# Patient Record
Sex: Female | Born: 1970
Health system: Western US, Academic
[De-identification: ages and names within clinical notes are randomized; demographics above are authoritative.]

## PROBLEM LIST (undated history)

## (undated) DIAGNOSIS — Z8742 Personal history of other diseases of the female genital tract: Secondary | ICD-10-CM

## (undated) DIAGNOSIS — D219 Benign neoplasm of connective and other soft tissue, unspecified: Secondary | ICD-10-CM

## (undated) DIAGNOSIS — F32A Depression, unspecified: Secondary | ICD-10-CM

## (undated) DIAGNOSIS — A64 Unspecified sexually transmitted disease: Secondary | ICD-10-CM

## (undated) DIAGNOSIS — F419 Anxiety disorder, unspecified: Secondary | ICD-10-CM

## (undated) HISTORY — DX: Benign neoplasm of connective and other soft tissue, unspecified: D21.9

## (undated) HISTORY — DX: Depression, unspecified: F32.A

## (undated) HISTORY — DX: Unspecified sexually transmitted disease: A64

## (undated) HISTORY — PX: ABDOMINAL SURGERY: SHX537

## (undated) HISTORY — DX: Personal history of other diseases of the female genital tract: Z87.42

## (undated) HISTORY — PX: ESSURE TUBAL LIGATION: SUR464

## (undated) HISTORY — PX: BREAST BIOPSY: SHX20

## (undated) HISTORY — DX: Anxiety disorder, unspecified: F41.9

## (undated) HISTORY — PX: AUGMENTATION MAMMAPLASTY: SUR837

## (undated) HISTORY — PX: COSMETIC SURGERY: SHX468

---

## 2017-05-31 HISTORY — PX: CERVICAL BIOPSY  W/ LOOP ELECTRODE EXCISION: SUR135

## 2019-10-03 NOTE — Progress Notes
Date: 10/08/2019  Name: Samantha Waters   MRN#: [id number]   DOB: 1970/12/22   Age: 49 y.o.  _______________________________________________________________________________________________________________    Kathrin Penner. Lowella Fairy, MD  Lindell Noe, PA-C    CHIEF COMPLAINT: Right elbow pain     REFERRING PHYSICIAN: No primary care provider on file.    HISTORY PRESENT ILLNESS: The patient is a 49 y.o. year old Female  who presents to the office for an evaluation of her right elbow. The patient denies any injury to right elbow, however, notes persistent pain for approximately 14 weeks.  She states that she may have exacerbated her right elbow symptoms while moving boxes around that same time is not definitive. Since her onset of pain symptoms, the patient states difficulty with extending and flexing her right elbow. Patient goes on to states that her right elbow pain can keep her up at night.     The patient reports dull to sharp pain in the right elbow rated at a 7/10 in severity. The patient has increased discomfort in the elbow with any lifting, gripping, twisting, and pulling activities. She notes grabbing a cup of coffee and hold on to a steering wheel can aggravate her pain symptoms. Overall, her symptoms are progressively getting worse.      ROS:  A COVID-19 questionnaire was filled out by the patient prior to the visit that included questions about having had a positive COVID-19 diagnosis in the last 14 days, contact with anybody diagnosed with  COVID-19 in the last 14 days, fever, headaches, unexplained muscle pain, weakness, diarrhea, nausea, vomiting, abdominal pain, respiratory illness/cough, shortness of breath, loss of smell, loss of taste, rash, skin irritation, unexplained hemorrhage, and fatigue. The responses to those questions were negative. A temperature was taken and it was less than 100 F.      PREVIOUS MEDICAL HISTORY: History reviewed. No pertinent past medical history.    PREVIOUS SURGICAL HISTORY: History reviewed. No pertinent surgical history.    MEDICATIONS:   Current Outpatient Medications   Medication Sig   ??? meloxicam 7.5 mg tablet Take 1 tablet (7.5 mg total) by mouth two (2) times daily as needed for Pain.     No current facility-administered medications for this visit.        ALLERGIES: No Known Allergies    SOCIAL HISTORY:   Social History     Tobacco Use   ??? Smoking status: Never Smoker   ??? Smokeless tobacco: Never Used   Substance Use Topics   ??? Alcohol use: Yes   ??? Drug use: Never       FAMILY HISTORY: History reviewed. No pertinent family history.    REVIEW OF SYSTEMS: The 14-point review of systems as documented today in the medical record is remarkable for the positive orthopedic problems discussed above and their relevance was considered with respect to Constitutional, ENT, Cardiovascular, GU, Skin, Neurologic, Endocrine, Hematologic, Psychiatric, Gastrointestinal, Respiratory, Eyes and Allergic/Immunologic systems.    Examination:  Ht 4' 11'' (1.499 m)  ~ Wt 139 lb (63 kg)  ~ BMI 28.07 kg/m???     General: The patient is well-developed, well-nourished, and in no acute distress.  Body habitus is normal.  Patient is oriented ???3, to place, time and person.  Judgment, mood and affect are appropriate.  External exam of eyes, ears, nose and mouth reveal no deformities, scars or lesions.  Respirations are regular and unlabored.  Respiratory effort is normal with no evidence of abnormal intercostal retraction, or excessive use of accessory  muscles.  Extraocular movements are intact, pupils are symmetric.  No conjunctivitis or icterus is present.  Skin appears to be normal without rashes, lesions, or ulcers.  Pulses regular.  No cyanosis, clubbing or edema is evident.  Patient has no movement disorder or loss of sensation except as described below.  Gait and station are within normal limits except as described below.  No amputations are fixed deformities are evident.    Musculoskeletal:  RIGHT elbow: Inspection: No swelling is noted.  No scars are noted.    There is no deformity noted.    Palpation: There is mild tenderness noted laterally.  No effusion is present.    Range of motion exam:  Flexion: 120???  Extension: 0???  with pain.    Pronation: 70???   Supination: 70???     Crepitus is noted with range of motion.  There is no instability noted in the joint. Distal vitals are intact.  Neurologically intact from C5 to T1.  Skin is intact.  Normal capillary refill with 2+ radial and ulnar pulses.        Neurologic: No gross motor or sensory deficits    Vascular: Distal pulses are intact    Skin: No rashes or lesions    X-rays: Images were ordered, obtained and reviewed with the patient today at [facility].  Multiple views of the elbow include AP, lateral, and oblique views.  These were reviewed with the patient, and used for medical decision making.  These show no fractures, no deformities, and no dislocations.     Impression: right Lateral epicondylitis    Medical decision making: We had a lengthy discussion with the patient today regarding her condition. I discussed with the patient their physical exam findings, radiographs and imaging, and above diagnosis. We discussed the treatment options including anti-inflammatory medications, cortisone versus PRP injections, physical therapy, and tennis elbow bracing. At this time we will manage the patient???s symptoms conservatively. A corticosteroid injection was recommended to the patient. This injection is both diagnostic and therapeutic. The risks associated with the injection were discussed with the patient and they have elected to proceed. She was also educated to massage her area of pain symptoms to desensitize her symptoms. She is to continue with a tennis elbow strap for comfort and pain relief.  At this point we would like to start her on Mobic 15 mg p.o. daily with food and to stop all other anti-inflammatories.      All of the patient's questions and concerns were addressed.    Hand/Foot/Upper/Lower Extrem Drain/Inject: R elbow    Date/Time: 10/08/2019 7:10 AM  Performed by: Andres Shad., MD  Authorized by: Andres Shad., MD     Consent Given by:  Patient  Site marked: the procedure site was marked    Timeout: prior to procedure the correct patient, procedure, and site was verified    Indications:  Pain  Condition: epicondylitis, lateral    Location:  Tendon origin  Site:  R elbow  Approach:  Lateral  Needle Size:  25 G  Medications:  1 mL lidocaine 1%; 6 mg betamethasone acetate-betamethasone sodium phosphate 6 (3-3) mg/mL  Patient tolerance:  Patient tolerated the procedure well with no immediate complications   The patient has elected to proceed with cortisone injection. Risks of hypopigmentation, hyperpigmentation, subcutaneous fat atrophy, tendon rupture, transient hyperglycemia and allergic reactions were all discussed. After alcohol prep, 6 mg Celestone, mixed with 1 mL of 1% lidocaine plain was injected without incident.  The patient tolerated the procedure well.  Cortisone was administered deep to the common extensor tendon origin distal to the right lateral epicondyle.        Follow-up: The patient will follow up in 3-4 weeks for re-evaluation, or sooner if necessary.        Melba Coon MD  Sports Medicine  Orthopedic Surgery

## 2019-10-08 ENCOUNTER — Ambulatory Visit: Payer: PRIVATE HEALTH INSURANCE

## 2019-10-08 DIAGNOSIS — M7711 Lateral epicondylitis, right elbow: Secondary | ICD-10-CM

## 2019-10-08 DIAGNOSIS — M25521 Pain in right elbow: Secondary | ICD-10-CM

## 2019-10-08 MED ORDER — MELOXICAM 7.5 MG PO TABS
7.5 mg | ORAL_TABLET | Freq: Two times a day (BID) | ORAL | 1 refills | Status: AC | PRN
Start: 2019-10-08 — End: ?

## 2019-10-09 MED ADMIN — BETAMETHASONE SOD PHOS & ACET 6 (3-3) MG/ML IJ SUSP: INTRA_ARTICULAR | @ 14:00:00 | Stop: 2019-10-08 | NDC 00517072001

## 2019-10-09 MED ADMIN — LIDOCAINE HCL 1 % IJ SOLN: INTRA_ARTICULAR | @ 14:00:00 | Stop: 2019-10-08 | NDC 00409427601

## 2019-10-30 ENCOUNTER — Ambulatory Visit: Payer: PRIVATE HEALTH INSURANCE | Attending: Medical

## 2019-10-30 DIAGNOSIS — M7711 Lateral epicondylitis, right elbow: Secondary | ICD-10-CM

## 2019-10-30 NOTE — Progress Notes
Date: 10/30/2019  Name: Samantha Waters   Centricity#: [id number]   DOB: 01-09-1971   Age: 49 y.o.      Loraine Leriche L. Lowella Fairy, MD  Lindell Noe, PA-C      CHIEF COMPLAINT:   Chief Complaint   Patient presents with   ??? Right Elbow - Follow-up       REFERRING PHYSICIAN: No primary care provider on file.    HISTORY PRESENT ILLNESS: The patient is a 49 y.o. year old Female  who returns today for follow up status post cortisone injection on 10/08/2019.  The patient is being treated for right elbow Lateral epicondylitis with  []  Physical Therapy   [x]   cortisone injections   [x]  home exercises   []  tennis elbow strap   []  Activity modifications   []  PRP injection   [x]  Meloxicam   []  Cast Immobilization  []  Surgery. The patient is doing better.  She reports episodes of pain within the elbow with squeezing and lifting.  Her range of motion is improved since the injection.  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  The patient denies any new injury. They are currently attending physical therapy.     Physical Exam:   Right Elbow:   Inspection: No swelling is noted. no scars are noted.    There is no deformity noted.    Palpation: There is  []  No  []  Mild  [x]  Moderate  []  Severe  tenderness noted laterally.  No effusion is present.    Range of motion exam:  Flexion: 130???  Extension: 0???  without pain.    Pronation: 80???   Supination: 80???     Crepitus is not noted with range of motion.  There is no instability noted in the joint. Distal vitals are intact.  Skin is intact.  Normal capillary refill with 2+ radial and ulnar pulses.       Resisted Wrist Extension Test:   [x]   Positive    []  Negative    []  Not tested  Resisted  Wrist Flexion Test:      []   Positive    []  Negative    [x]  Not tested  Long Finger Test:            []   Positive    [x]  Negative    []  Not tested  Pain with Resisted Supination:   []   Yes           [x]   No  Pain with Resisted Pronation:     []   Yes           [x]   No  Milking Sign:            []   Positive    []  Negative    [x]  Not tested     Neurologic: No gross motor or sensory deficits    Vascular: Distal pulses are intact    Skin: No rashes or lesions    X-Ray: None taken today.     Impression: Right elbow Lateral epicondylitis    Plan: The patient is doing okay.  An examination was performed today.   I recommend that she continues to use ice and heat as necessary. Pain medications were not prescribed.  The patient is currently using Meloxicam 7.5 mg for pain control. The patient will hold off on physical therapy.  Continue the current treatment plan.  A home exercise program  has been provided to the patient.  At home  stretches and strengthening as were demonstrated with the patient in clinic today.  She will continue with conservative treatment.  If her symptoms worsens we discussed PRP injections.  She is provided with information on PRP injection today.  She will follow up in 4-6 weeks for re-evaluation.    Follow-up with our office Return in about 6 weeks (around 12/11/2019)., sooner if necessary.     The patient was examined and evaluated by Lindell Noe, PA-C for Melba Coon, M.D. The evaluation and plan was reviewed and approved by Melba Coon, M.D.        _______________________  Lindell Noe, PA-C    Patient was initially seen by Dr. Lowella Fairy on 10/08/2019 in the [place] office at which time of plan of care was established to treat patient's condition.  I have reviewed the plan of care outlined by the supervising physician.  Patient is seen in follow-up today incident to that visit to initiate plan of care as per the supervising physician who is present in the office today.      A COVID-19 questionnaire was filled out by the patient prior to the visit that included questions about having had a positive COVID-19 diagnosis in the last 14 days, contact with anybody diagnosed with  COVID-19 in the last 14 days, fever, headaches, unexplained muscle pain, weakness, diarrhea, nausea, vomiting, abdominal pain, respiratory illness/cough, shortness of breath, loss of smell, loss of taste, rash, skin irritation, unexplained hemorrhage and fatigue. The responses to those questions were negative. A temperature was taken and it was less than 100 F.      cc: No primary care provider on file.

## 2019-12-12 ENCOUNTER — Ambulatory Visit: Payer: PRIVATE HEALTH INSURANCE | Attending: Medical

## 2020-02-16 ENCOUNTER — Ambulatory Visit: Payer: PRIVATE HEALTH INSURANCE

## 2021-06-24 LAB — EXTERNAL GENERIC LAB PROCEDURE: COLOGUARD: NEGATIVE

## 2021-06-24 LAB — COLOGUARD: COLOGUARD: NEGATIVE

## 2021-09-16 ENCOUNTER — Other Ambulatory Visit: Payer: Self-pay | Admitting: Nurse Practitioner

## 2021-09-16 DIAGNOSIS — N950 Postmenopausal bleeding: Secondary | ICD-10-CM

## 2021-09-26 ENCOUNTER — Ambulatory Visit
Admission: RE | Admit: 2021-09-26 | Discharge: 2021-09-26 | Disposition: A | Discharge status: Home / Self Care | Payer: 59 | Source: Ambulatory Visit | Attending: Nurse Practitioner | Admitting: Nurse Practitioner

## 2021-09-26 ENCOUNTER — Other Ambulatory Visit: Payer: Self-pay

## 2021-09-26 DIAGNOSIS — N950 Postmenopausal bleeding: Secondary | ICD-10-CM

## 2021-09-26 DIAGNOSIS — N83209 Unspecified ovarian cyst, unspecified side: Secondary | ICD-10-CM | POA: Insufficient documentation

## 2021-09-29 ENCOUNTER — Encounter: Payer: Self-pay | Admitting: Nurse Practitioner

## 2021-10-05 ENCOUNTER — Encounter: Payer: Self-pay | Admitting: Obstetrics and Gynecology

## 2021-10-05 ENCOUNTER — Ambulatory Visit (INDEPENDENT_AMBULATORY_CARE_PROVIDER_SITE_OTHER): Payer: 59 | Admitting: Obstetrics and Gynecology

## 2021-10-05 VITALS — BP 130/78 | HR 84 | Ht 58.5 in | Wt 140.0 lb

## 2021-10-05 DIAGNOSIS — N9489 Other specified conditions associated with female genital organs and menstrual cycle: Secondary | ICD-10-CM

## 2021-10-05 DIAGNOSIS — D219 Benign neoplasm of connective and other soft tissue, unspecified: Secondary | ICD-10-CM | POA: Diagnosis not present

## 2021-10-05 DIAGNOSIS — N939 Abnormal uterine and vaginal bleeding, unspecified: Secondary | ICD-10-CM

## 2021-10-05 NOTE — Progress Notes (Unsigned)
GYNECOLOGY  VISIT   HPI: 51 y.o.   Married  Caucasian  female   Obstetric history G6P3L4. with Patient's last menstrual period was 08/10/2021 (exact date).   here for abnormal bleeding.  Bleeds 2 weeks after her cycle and with exercise.   States she would like a hysterectomy.  Declines future childbearing.   Hx of bleeding problems that are long term.   Her periods never completely stopped.  Has not gone a full year without a menstrual cycle. Had mood changes, brain fog, headaches, and irregular cycles, so she started HRT. Never had a hot flash.  She has stopped her HRT and does not feel different after stopping her HRT.   Long hx of endometrial cells on paps.  Her last EMB was in June 2022 due to a 22 day menstrual cycle.  Hx fibroids.   She had a pelvic ultrasound on 09/26/21.  Study Result  Narrative & Impression  CLINICAL DATA:  Post menopausal bleeding   EXAM: TRANSABDOMINAL AND TRANSVAGINAL ULTRASOUND OF PELVIS   TECHNIQUE: Both transabdominal and transvaginal ultrasound examinations of the pelvis were performed. Transabdominal technique was performed for global imaging of the pelvis including uterus, ovaries, adnexal regions, and pelvic cul-de-sac. It was necessary to proceed with endovaginal exam following the transabdominal exam to visualize the uterus endometrium ovaries.   COMPARISON:  None Available.   FINDINGS: Uterus   Measurements: 9.2 x 5.8 x 6.1 cm = volume: 172 mL. Uterine fibroids, the largest most discrete masses are measured. Anterior uterine corpus intramural fibroid measures 16 by 20 x 25 mm. Subserosal posterior hypoechoic fibroid measures 15 x 16 x 20 mm.   Endometrium   Thickness: 6.3 mm.  No focal abnormality visualized.   Right ovary   Measurements: 2.9 x 2.3 x 1.7 cm = volume: 5.8 mL. Normal appearance/no adnexal mass.   Left ovary   Left ovarian tissue not discretely visualized. On trans abdominal images, there is a left  adnexal hypoechoic cyst measuring 34 x 35 x 31 mm which may contain a few internal septa.   Other findings   No abnormal free fluid.   IMPRESSION: 1. Endometrial thickness of 6.3 mm. In the setting of post-menopausal bleeding, endometrial sampling is indicated to exclude carcinoma. If results are benign, sonohysterogram should be considered for focal lesion work-up. (Ref: Radiological Reasoning: Algorithmic Workup of Abnormal Vaginal Bleeding with Endovaginal Sonography and Sonohysterography. AJR 2008; 616:W73-71) 2. Uterine fibroids 3. 3.5 cm complex left adnexal cyst. Surgical consultation is recommended.     Electronically Signed   By: Donavan Foil M.D.   On: 09/26/2021 15:21     Has health anxiety.  First husband died from cancer.   GYNECOLOGIC HISTORY: Patient's last menstrual period was 08/10/2021 (exact date). Contraception:  Essure Menopausal hormone therapy:  none Last mammogram: 04/2021 extremely dense breasts;rec.breast US--done in [place] Last pap smear: 05-02-21 ASCUS:Pos HR HPV;06-21-21 ECC Neg.  Hx of LEEP revealing CIN II 2019        OB History     Gravida  6   Para  3   Term  2   Preterm  1   AB  3   Living  4      SAB  3   IAB      Ectopic      Multiple  1   Live Births  4              There are no problems to display for this patient.  Past Medical History:  Diagnosis Date   Anxiety    Depression    Fibroid    History of abnormal cervical Pap smear    Hx LEEP procedure 2019   STD (sexually transmitted disease)    HR HPV    Past Surgical History:  Procedure Laterality Date   ABDOMINAL SURGERY     "tummy tuck"   AUGMENTATION MAMMAPLASTY     silicone breast implants   CERVICAL BIOPSY  W/ LOOP ELECTRODE EXCISION  05/31/2017   CESAREAN SECTION     ESSURE TUBAL LIGATION      No current outpatient medications on file.   No current facility-administered medications for this visit.     ALLERGIES: Patient has no known  allergies.  Family History  Problem Relation Age of Onset   Hypertension Mother    Diabetes Mother    Stroke Father    Hyperlipidemia Father    Coronary artery disease Father    Diabetes Brother    Breast cancer Paternal Grandmother 26    Social History   Socioeconomic History   Marital status: Married    Spouse name: Not on file   Number of children: Not on file   Years of education: Not on file   Highest education level: Not on file  Occupational History   Not on file  Tobacco Use   Smoking status: Former    Types: Cigarettes   Smokeless tobacco: Never  Vaping Use   Vaping Use: Never used  Substance and Sexual Activity   Alcohol use: Never   Drug use: Not on file   Sexual activity: Yes    Birth control/protection: Surgical    Comment: Visual merchandiser, 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   PHYSICAL EXAMINATION:    BP 130/78   Pulse 84   Ht 4' 10.5" (1.486 m)   Wt 140 lb (63.5 kg)   LMP 08/10/2021 (Exact Date)   SpO2 99%   BMI 28.76 kg/m     General appearance: alert, cooperative and appears stated age Head: Normocephalic, without obvious abnormality, atraumatic Neck: no adenopathy, supple, symmetrical, trachea midline and thyroid normal to inspection and palpation Lungs: clear to auscultation bilaterally Heart: regular rate and rhythm Abdomen: soft, non-tender, no masses,  no organomegaly Extremities: extremities normal, atraumatic, no cyanosis or edema Skin: Skin color, texture, turgor normal. No rashes or lesions Lymph nodes: Cervical, supraclavicular,  and axillary nodes normal. No abnormal inguinal nodes palpated Neurologic: Grossly normal  Pelvic: External genitalia:  no lesions              Urethra:  normal appearing urethra with no masses, tenderness or lesions              Bartholins and Skenes: normal                 Vagina: normal appearing vagina with normal color and discharge, no lesions              Cervix: no lesions  Bimanual Exam:  Uterus:  normal size, contour, position, consistency, mobility, non-tender              Adnexa: no mass, fullness, tenderness              Rectal exam: yes.  Confirms.              Anus:  normal sphincter tone, no lesions  Chaperone was present for exam:  yes.  ASSESSMENT  Fibroids.  Left ovarian cyst.  Abnormal uterine bleeding.  Stopped HRT.   PLAN  Return for endometrial biopsy and recheck.  We discussed potential hysterectomy.     An After Visit Summary was printed and given to the patient.  ______ minutes face to face time of which over 50% was spent in counseling.

## 2021-10-05 NOTE — Patient Instructions (Signed)
Total Laparoscopic Hysterectomy A total laparoscopic hysterectomy is a minimally invasive surgery to remove the uterus and cervix. The fallopian tubes and ovaries can also be removed during this surgery, if necessary. This procedure may be done to treat problems such as: Growths in the uterus (uterine fibroids) that are not cancer but cause symptoms. A condition that causes the lining of the uterus to grow in other areas (endometriosis). Problems with pelvic support. Cancer of the cervix, ovaries, uterus, or tissue that lines the uterus (endometrium). Excessive bleeding in the uterus. After this procedure, you will no longer be able to have a baby, and you will no longer have a menstrual period. Tell a health care provider about: Any allergies you have. All medicines you are taking, including vitamins, herbs, eye drops, creams, and over-the-counter medicines. Any problems you or family members have had with anesthetic medicines. Any blood disorders you have. Any surgeries you have had. Any medical conditions you have. Whether you are pregnant or may be pregnant. What are the risks? Generally, this is a safe procedure. However, problems may occur, including: Infection. Bleeding. Blood clots in the legs or lungs. Allergic reactions to medicines. Damage to nearby structures or organs. Having to change from this surgery to one in which a large incision is made in the abdomen (abdominal hysterectomy). What happens before the procedure? Staying hydrated Follow instructions from your health care provider about hydration, which may include: Up to 2 hours before the procedure - you may continue to drink clear liquids, such as water, clear fruit juice, black coffee, and plain tea.  Eating and drinking restrictions Follow instructions from your health care provider about eating and drinking, which may include: 8 hours before the procedure - stop eating heavy meals or foods, such as meat, fried  foods, or fatty foods. 6 hours before the procedure - stop eating light meals or foods, such as toast or cereal. 6 hours before the procedure - stop drinking milk or drinks that contain milk. 2 hours before the procedure - stop drinking clear liquids. Medicines Ask your health care provider about: Changing or stopping your regular medicines. This is especially important if you are taking diabetes medicines or blood thinners. Taking medicines such as aspirin and ibuprofen. These medicines can thin your blood. Do not take these medicines unless your health care provider tells you to take them. Taking over-the-counter medicines, vitamins, herbs, and supplements. You may be asked to take medicine that helps you have a bowel movement (laxative) to prevent constipation. General instructions If you were asked to do bowel preparation before the procedure, follow instructions from your health care provider. This procedure can affect the way you feel about yourself. Talk with your health care provider about the physical and emotional changes hysterectomy may cause. Do not use any products that contain nicotine or tobacco for at least 4 weeks before the procedure. These products include cigarettes, chewing tobacco, and vaping devices, such as e-cigarettes. If you need help quitting, ask your health care provider. Plan to have a responsible adult take you home from the hospital or clinic. Plan to have a responsible adult care for you for the time you are told after you leave the hospital or clinic. This is important. Surgery safety Ask your health care provider: How your surgery site will be marked. What steps will be taken to help prevent infection. These may include: Removing hair at the surgery site. Washing skin with a germ-killing soap. Receiving antibiotic medicine. What happens during the  procedure? An IV will be inserted into one of your veins. You will be given one or more of the following: A  medicine to help you relax (sedative). A medicine to make you fall asleep (general anesthetic). A medicine to numb the area (local anesthetic). A medicine that is injected into your spine to numb the area below and slightly above the injection site (spinal anesthetic). A medicine that is injected into an area of your body to numb everything below the injection site (regional anesthetic). A gas will be used to inflate your abdomen. This will allow your surgeon to look inside your abdomen and do the surgery. Three or four small incisions will be made in your abdomen. A small device with a light (laparoscope) will be inserted into one of your incisions. Surgical instruments will be inserted through the other incisions in order to perform the procedure. Your uterus and cervix may be removed through your vagina or cut into small pieces and removed through the small incisions. Any other organs that need to be removed will also be removed this way. The gas will be released from inside your abdomen. Your incisions will be closed with stitches (sutures), skin glue, or adhesive strips. A bandage (dressing) may be placed over your incisions. The procedure may vary among health care providers and hospitals. What happens after the procedure? Your blood pressure, heart rate, breathing rate, and blood oxygen level will be monitored until you leave the hospital or clinic. You will be given medicine for pain as needed. You will be encouraged to walk as soon as possible. You will also use a device to help you breathe or do breathing exercises to keep your lungs clear. You may have to wear compression stockings. These stockings help to prevent blood clots and reduce swelling in your legs. You will need to wear a sanitary pad for vaginal discharge or bleeding. Summary Total laparoscopic hysterectomy is a procedure to remove your uterus, cervix, and sometimes the fallopian tubes and ovaries. This procedure can  affect the way you feel about yourself. Talk with your health care provider about the physical and emotional changes hysterectomy may cause. After this procedure, you will no longer be able to have a baby, and you will no longer have a menstrual period. You will be given pain medicine to control discomfort after this procedure. Plan to have a responsible adult take you home from the hospital or clinic. This information is not intended to replace advice given to you by your health care provider. Make sure you discuss any questions you have with your health care provider. Document Revised: 12/05/2019 Document Reviewed: 12/05/2019 Elsevier Patient Education  [place].

## 2021-10-06 LAB — CA 125: CA 125: 18 U/mL (ref <35)

## 2021-10-12 ENCOUNTER — Encounter: Payer: Self-pay | Admitting: Family Medicine

## 2021-10-12 ENCOUNTER — Ambulatory Visit (INDEPENDENT_AMBULATORY_CARE_PROVIDER_SITE_OTHER): Payer: 59 | Admitting: Family Medicine

## 2021-10-12 VITALS — BP 110/78 | HR 75 | Temp 97.1°F | Ht 58.5 in | Wt 143.2 lb

## 2021-10-12 DIAGNOSIS — N939 Abnormal uterine and vaginal bleeding, unspecified: Secondary | ICD-10-CM

## 2021-10-12 DIAGNOSIS — F419 Anxiety disorder, unspecified: Secondary | ICD-10-CM | POA: Insufficient documentation

## 2021-10-12 DIAGNOSIS — F331 Major depressive disorder, recurrent, moderate: Secondary | ICD-10-CM | POA: Diagnosis not present

## 2021-10-12 DIAGNOSIS — F32A Depression, unspecified: Secondary | ICD-10-CM | POA: Diagnosis not present

## 2021-10-12 DIAGNOSIS — F329 Major depressive disorder, single episode, unspecified: Secondary | ICD-10-CM | POA: Insufficient documentation

## 2021-10-12 HISTORY — DX: Abnormal uterine and vaginal bleeding, unspecified: N93.9

## 2021-10-12 MED ORDER — HYDROXYZINE HCL 10 MG PO TABS: 10.0000 mg | ORAL_TABLET | Freq: Three times a day (TID) | ORAL | 0 refills | Status: DC | PRN

## 2021-10-12 MED ORDER — BUPROPION HCL ER (XL) 150 MG PO TB24: 150.0000 mg | ORAL_TABLET | Freq: Every day | ORAL | 2 refills | Status: DC

## 2021-10-12 NOTE — Patient Instructions (Signed)
Start Wellbutrin daily and  Atarax as needed

## 2021-10-12 NOTE — Assessment & Plan Note (Signed)
Elevated GAD-7 Trial Wellbutrin 150 mg XR plus augmentation with hydroxyzine If no improvement or worsening, consider referral to psychiatry

## 2021-10-12 NOTE — Progress Notes (Signed)
Sierra Cantrell is a 51 y.o. female who presents today for an office visit.  Assessment/Plan:   Problem List Items Addressed This Visit       Genitourinary   Abnormal uterine bleeding (AUB)     Other   Anxiety    Elevated GAD-7 Trial Wellbutrin 150 mg XR plus augmentation with hydroxyzine If no improvement or worsening, consider referral to psychiatry      Relevant Medications   buPROPion (WELLBUTRIN XL) 150 MG 24 hr tablet   hydrOXYzine (ATARAX) 10 MG tablet   MDD (major depressive disorder)    Chronic No SI/HI Failed multiple SSRIs Discussed referral to psychiatry versus counseling versus primary care management We will trial Wellbutrin 150 mg XR If no improvement or worsening, low threshold to refer to psychiatry      Relevant Medications   buPROPion (WELLBUTRIN XL) 150 MG 24 hr tablet   hydrOXYzine (ATARAX) 10 MG tablet   RESOLVED: Depression - Primary   Relevant Medications   buPROPion (WELLBUTRIN XL) 150 MG 24 hr tablet   hydrOXYzine (ATARAX) 10 MG tablet       Subjective:  HPI:  Sierra Cantrell is a 51 y.o. female who has Anxiety; Abnormal uterine bleeding (AUB); and MDD (major depressive disorder) on their problem list..   She  has a past medical history of Anxiety, Depression, Fibroid, History of abnormal cervical Pap smear, and STD (sexually transmitted disease)..   She presents with chief complaint of Establish Care (PHQ/GAD . Patient will bring in colorguard,pap and mammogram records on next visit) .   Patient reports that she has recently to [place] from [place] roughly about a month ago.  She moved out with her husband.  Does not have any other family in the area.  Her 3 children and 2 grandchildren are back in [place].  She says that she feels very depressed that she has nothing to do.  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  Patient reports a nearly  lifelong history of MDD and anxiety. Patient has taken Paxil, Celexa, and Lexapro in the past.  These have helped some, but patient did have significant weight gain with these.    Patient has a history of mood disturbance associated with menopause.  Was on HRT with this.  Patient recently also had abnormal uterine bleeding.  Patient did have pelvic ultrasound that showed 6 cm endometrial thickening and an left-sided 3 cm adnexal mass.  Patient has seen OB/GYN for this.  They have an upcoming planned endometrial biopsy.  CA 125 was within normal limits.       Objective:  Physical Exam: BP 110/78 (BP Location: Left Arm, Patient Position: Sitting, Cuff Size: Large)   Pulse 75   Temp (!) 97.1 F (36.2 C) (Temporal)   Ht 4' 10.5" (1.486 m)   Wt 143 lb 3.2 oz (65 kg)   LMP 08/10/2021 (Exact Date)   SpO2 98%   BMI 29.42 kg/m    General: No acute distress. Awake and conversant.  Eyes: Normal conjunctiva, anicteric. Round symmetric pupils.  ENT: Hearing grossly intact. No nasal discharge.  Neck: Neck is supple. No masses or thyromegaly.  Respiratory: Respirations are non-labored. No auditory wheezing.  Skin: Warm. No rashes or ulcers.  Psych: Alert and oriented. Cooperative, tearful, Normal judgment.  CV: No cyanosis or JVD MSK: Normal ambulation. No clubbing  Neuro: Sensation and CN II-XII grossly normal.  Alesia Banda, MD, MS

## 2021-10-12 NOTE — Assessment & Plan Note (Signed)
Chronic No SI/HI Failed multiple SSRIs Discussed referral to psychiatry versus counseling versus primary care management We will trial Wellbutrin 150 mg XR If no improvement or worsening, low threshold to refer to psychiatry

## 2021-10-16 ENCOUNTER — Telehealth: Payer: Self-pay | Admitting: Obstetrics and Gynecology

## 2021-10-16 NOTE — Telephone Encounter (Signed)
The patient called yesterday, she feels that her abdomen is growing and getting tight. She is very worried that something is wrong. She was diagnosed with fibroids and a complex cyst on 09/26/21. She is scheduled to see Dr Quincy Simmonds min July and doesn't feel she can wait until next week to have a follow up appointment with Dr Quincy Simmonds.  Please find a spot for her to be seen this week.

## 2021-10-17 NOTE — Telephone Encounter (Signed)
Patient is scheduled 10/20/21 at 1:30pm with Dr. Quincy Simmonds.

## 2021-10-17 NOTE — Telephone Encounter (Signed)
Encounter reviewed and closed.  

## 2021-10-17 NOTE — Telephone Encounter (Signed)
This message forwarded high priority to the appt desk.

## 2021-10-19 ENCOUNTER — Other Ambulatory Visit: Payer: Self-pay

## 2021-10-19 DIAGNOSIS — N939 Abnormal uterine and vaginal bleeding, unspecified: Secondary | ICD-10-CM

## 2021-10-19 NOTE — Progress Notes (Signed)
GYNECOLOGY  VISIT   HPI: 51 y.o.   Married  Caucasian  female   [phone] with Patient's last menstrual period was 08/10/2021 (exact date).   here for endometrial biopsy.    Patient is having abnormal uterine bleeding, and she has known fibroids.  Also has a small complex left ovarian cyst. US done 10/05/21.  See Epic. CA125 18.   Patient having increased abdominal pressure, tightness around her umbilicus, and her pants feel uncomfortable.  Also having twinges and flutters.  She was in the process of moving when she experienced her increased symptoms.   Had vaginal spotting around 09/26/21 at the time of her recent pelvic ultrasound.   Is now off HRT.  States she never stopped bleeding prior to staring HRT.   She has had an Essure.  Planning on a trip on August 19, and is going to [place]. Father is in Hospice care and just had a stroke a few days ago.  He is unable to speak.  The family will reunite in August to see him.    GYNECOLOGIC HISTORY: Patient's last menstrual period was 08/10/2021 (exact date). Contraception:  Essure Menopausal hormone therapy:  none Last mammogram:  04/2021 extremely dense breasts;rec.breast US--done in [place] Last pap smear:   05-02-21 ASCUS:Pos HR HPV;06-21-21 ECC Neg.  Hx of LEEP revealing CIN II 2019        OB History     Gravida  6   Para  3   Term  2   Preterm  1   AB  3   Living  4      SAB  3   IAB      Ectopic      Multiple  1   Live Births  4              Patient Active Problem List   Diagnosis Date Noted   Anxiety 10/12/2021   Abnormal uterine bleeding (AUB) 10/12/2021   MDD (major depressive disorder) 10/12/2021    Past Medical History:  Diagnosis Date   Anxiety    Depression    Fibroid    History of abnormal cervical Pap smear    Hx LEEP procedure 2019   STD (sexually transmitted disease)    HR HPV    Past Surgical History:  Procedure Laterality Date   ABDOMINAL SURGERY     "tummy tuck"    AUGMENTATION MAMMAPLASTY     silicone breast implants   CERVICAL BIOPSY  W/ LOOP ELECTRODE EXCISION  05/31/2017   CESAREAN SECTION     COSMETIC SURGERY  10/2009 10/2013   ESSURE TUBAL LIGATION      No current outpatient medications on file.   No current facility-administered medications for this visit.     ALLERGIES: Wellbutrin [bupropion]  Family History  Problem Relation Age of Onset   Hypertension Mother    Diabetes Mother    Stroke Father    Hyperlipidemia Father    Coronary artery disease Father    Diabetes Brother    Breast cancer Paternal Grandmother 36    Social History   Socioeconomic History   Marital status: Married    Spouse name: Not on file   Number of children: Not on file   Years of education: Not on file   Highest education level: Not on file  Occupational History   Not on file  Tobacco Use   Smoking status: Former    Packs/day: 0.50    Years: 10.00  Total pack years: 5.00    Types: Cigarettes    Quit date: 09/25/2009    Years since quitting: 12.0   Smokeless tobacco: Never  Vaping Use   Vaping Use: Never used  Substance and Sexual Activity   Alcohol use: Never   Drug use: Never   Sexual activity: Yes    Birth control/protection: Surgical    Comment: Essure/partner vas, 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   PHYSICAL EXAMINATION:    BP 110/70   Ht 4' 10.5" (1.486 m)   Wt 140 lb (63.5 kg)   LMP 08/10/2021 (Exact Date)   BMI 28.76 kg/m     General appearance: alert, cooperative and appears stated age   Pelvic: External genitalia:  no  lesions              Urethra:  normal appearing urethra with no masses, tenderness or lesions              Bartholins and Skenes: normal                 Vagina: normal appearing vagina with normal color and discharge, no lesions              Cervix: no lesions                Bimanual Exam:  Uterus:  normal size, contour, position, consistency, mobility, non-tender              Adnexa: no mass, fullness, tenderness  EMB Consent done.  Sterile prep with Hibiclens.  Paracervical block with 10 cc 1% lidocaine.  Lot [id number], exp 04/18/23. EMB to 8 cm x 2.  Tissue to pathology.  No complications.  Minimal EBL.  Chaperone was present for exam:  Estill Bamberg, CMA  ASSESSMENT  Abdominal pressure. Ovulation? Abnormal uterine bleeding.  Fibroids.  Small complex left ovarian cyst.  Normal CA125.  Family health concern.   PLAN  Prior pelvic US report reviewed with patient.  FU EMB.  Check FSH and estradiol.  Pelvic US at the end of July.  Final recommendations to follow after EMB and labs have returned.  Support given for father's illness.  I encouraged reunion with him soon, if possible for her.    An After Visit Summary was printed and given to the patient.  30 min  total time was spent for this patient encounter, including preparation, face-to-face counseling with the patient, coordination of care, and documentation of the encounter in addition to performing endometrial biopsy.

## 2021-10-20 ENCOUNTER — Other Ambulatory Visit (HOSPITAL_COMMUNITY)
Admission: RE | Admit: 2021-10-20 | Discharge: 2021-10-20 | Disposition: A | Discharge status: Home / Self Care | Payer: 59 | Source: Ambulatory Visit | Attending: Obstetrics and Gynecology | Admitting: Obstetrics and Gynecology

## 2021-10-20 ENCOUNTER — Ambulatory Visit (INDEPENDENT_AMBULATORY_CARE_PROVIDER_SITE_OTHER): Payer: 59 | Admitting: Obstetrics and Gynecology

## 2021-10-20 ENCOUNTER — Encounter: Payer: Self-pay | Admitting: Obstetrics and Gynecology

## 2021-10-20 VITALS — BP 110/70 | Ht 58.5 in | Wt 140.0 lb

## 2021-10-20 DIAGNOSIS — N939 Abnormal uterine and vaginal bleeding, unspecified: Secondary | ICD-10-CM | POA: Diagnosis present

## 2021-10-20 DIAGNOSIS — Z01812 Encounter for preprocedural laboratory examination: Secondary | ICD-10-CM | POA: Diagnosis not present

## 2021-10-20 DIAGNOSIS — N83202 Unspecified ovarian cyst, left side: Secondary | ICD-10-CM

## 2021-10-20 DIAGNOSIS — Z8489 Family history of other specified conditions: Secondary | ICD-10-CM

## 2021-10-20 DIAGNOSIS — R109 Unspecified abdominal pain: Secondary | ICD-10-CM

## 2021-10-20 LAB — PREGNANCY, URINE: Preg Test, Ur: NEGATIVE

## 2021-10-21 LAB — ESTRADIOL: Estradiol: 130 pg/mL

## 2021-10-21 LAB — FOLLICLE STIMULATING HORMONE: FSH: 14.2 m[IU]/mL

## 2021-10-24 LAB — SURGICAL PATHOLOGY

## 2021-10-27 ENCOUNTER — Ambulatory Visit: Payer: 59 | Admitting: Obstetrics and Gynecology

## 2021-11-09 ENCOUNTER — Ambulatory Visit: Payer: 59 | Admitting: Family Medicine

## 2021-11-09 NOTE — Progress Notes (Signed)
GYNECOLOGY  VISIT   HPI: 51 y.o.   Married  Caucasian  female   [phone] with No LMP recorded.   here for pelvic ultrasound.   Bleeding now for 16 days.   Husband present for the visit.   Bled July 15 and 16th and then had spotting, but bleeding started again.  Some headaches also, which is her usual pattern.   Spotting is acceptable to her.  She is looking for reassurance that she does not have cancer.   Has long history of abnormal uterine bleeding and known fibroids.  Hx complex left ovarian cyst noted on pelvic US 10/05/21. CA125 was 18.   EMB done 10/20/21 showed benign secretory endometrium.  E2 130 and FSH 14.2 on 10/20/21.  She is off her HRT.   Planning a visit to see her father, who is in poor health.   GYNECOLOGIC HISTORY: No LMP recorded. Contraception:  Essure Menopausal hormone therapy:  none Last mammogram:  04/2021 extremely dense breasts;rec.breast US--done in [place] Last pap smear:   05-02-21 ASCUS:Pos HR HPV;06-21-21 ECC Neg.  Hx of LEEP revealing CIN II 2019               OB History     Gravida  6   Para  3   Term  2   Preterm  1   AB  3   Living  4      SAB  3   IAB      Ectopic      Multiple  1   Live Births  4              Patient Active Problem List   Diagnosis Date Noted   Anxiety 10/12/2021   Abnormal uterine bleeding (AUB) 10/12/2021   MDD (major depressive disorder) 10/12/2021    Past Medical History:  Diagnosis Date   Anxiety    Depression    Fibroid    History of abnormal cervical Pap smear    Hx LEEP procedure 2019   STD (sexually transmitted disease)    HR HPV    Past Surgical History:  Procedure Laterality Date   ABDOMINAL SURGERY     "tummy tuck"   AUGMENTATION MAMMAPLASTY     silicone breast implants   CERVICAL BIOPSY  W/ LOOP ELECTRODE EXCISION  05/31/2017   CESAREAN SECTION     COSMETIC SURGERY  10/2009 10/2013   ESSURE TUBAL LIGATION      No current outpatient medications on file.   No current  facility-administered medications for this visit.     ALLERGIES: Wellbutrin [bupropion]  Family History  Problem Relation Age of Onset   Hypertension Mother    Diabetes Mother    Stroke Father    Hyperlipidemia Father    Coronary artery disease Father    Diabetes Brother    Breast cancer Paternal Grandmother 28    Social History   Socioeconomic History   Marital status: Married    Spouse name: Not on file   Number of children: Not on file   Years of education: Not on file   Highest education level: Not on file  Occupational History   Not on file  Tobacco Use   Smoking status: Former    Packs/day: 0.50    Years: 10.00    Total pack years: 5.00    Types: Cigarettes    Quit date: 09/25/2009    Years since quitting: 12.1   Smokeless tobacco: Never  Vaping Use  Vaping Use: Never used  Substance and Sexual Activity   Alcohol use: Never   Drug use: Never   Sexual activity: Yes    Birth control/protection: Surgical    Comment: Essure/partner vas, first intercourse <16  Other Topics Concern   Not on file  Social History Narrative   Not on file   Social Determinants of Health   Financial Resource Strain: Not on file  Food Insecurity: Not on file  Transportation Needs: Not on file  Physical Activity: Not on file  Stress: Not on file  Social Connections: Not on file  Intimate Partner Violence: Not on file    Review of Systems  PHYSICAL EXAMINATION:    BP 122/68   Pulse 81   Ht 4' 10.5" (1.486 m)   Wt 140 lb (63.5 kg)   SpO2 94%   BMI 28.76 kg/m     General appearance: alert, cooperative and appears stated age   Pelvic US -  Uterus 9.10 x 6.53 x 4.81 cm.  Multiple fibroids:  intramural and subserosal.  0.92 cm, 1.54 cm, 1.36 cm, 1.02 cm, 0.95 cm, 1.05 cm.  EMS 4.76 mm.  Symmetrical.  Echogenic area at the fundus may extend through myometrium and outside the uterus. Possible Essure. (Not previously noted with pelvic US in June, 2023.) Left ovary 2.62 x  1.39 x 1.85 cm.  Right ovary 2.49 x 2.00 x 1.10 cm.  No adnexal masses.  Prominent vessels in the left adnexa. No free fluid.   ASSESSMENT  Abnormal uterine bleeding.  Fibroids.  Small complex left ovarian cyst.  Normal CA125. Cyst resolved.  Essure- possibly in the myometrium and traversing the serosa.  ASCUS pap, positive HR HPV, normal ECC at outside office.   PLAN  Pelvic US images and report reviewed including possible malposition of Essure.  Reassurance given that there is no suspicion for cancer.  Observational management of bleeding.  We have previously discussed potential laparoscopic hysterectomy.  Annual exam in January 2024 with pap and HR HPV then.  FU prn heavy or increased prolonged bleeding or pain.    An After Visit Summary was printed and given to the patient.  29 min  total time was spent for this patient encounter, including preparation, face-to-face counseling with the patient, coordination of care, and documentation of the encounter.

## 2021-11-10 ENCOUNTER — Ambulatory Visit (INDEPENDENT_AMBULATORY_CARE_PROVIDER_SITE_OTHER): Payer: 59

## 2021-11-10 ENCOUNTER — Other Ambulatory Visit: Payer: 59

## 2021-11-10 ENCOUNTER — Ambulatory Visit (INDEPENDENT_AMBULATORY_CARE_PROVIDER_SITE_OTHER): Payer: 59 | Admitting: Obstetrics and Gynecology

## 2021-11-10 VITALS — BP 122/68 | HR 81 | Ht 58.5 in | Wt 140.0 lb

## 2021-11-10 DIAGNOSIS — D219 Benign neoplasm of connective and other soft tissue, unspecified: Secondary | ICD-10-CM

## 2021-11-10 DIAGNOSIS — N939 Abnormal uterine and vaginal bleeding, unspecified: Secondary | ICD-10-CM | POA: Diagnosis not present

## 2021-11-10 DIAGNOSIS — Z8742 Personal history of other diseases of the female genital tract: Secondary | ICD-10-CM

## 2021-11-10 DIAGNOSIS — N83202 Unspecified ovarian cyst, left side: Secondary | ICD-10-CM | POA: Diagnosis not present

## 2021-11-11 ENCOUNTER — Encounter: Payer: Self-pay | Admitting: Obstetrics and Gynecology

## 2022-01-23 NOTE — Progress Notes (Unsigned)
GYNECOLOGY  VISIT   HPI: 51 y.o.   Married  Caucasian  female   [phone] with Patient's last menstrual period was 01/02/2022 (exact date).   here for right Breast pain.   Her father is in [place].   Had pinching at 12:00 of right breast, lasted 3 - 4 days.  Had a biopsy at 2:00, 2 years ago.  She is due for a mammogram.  Noticing swelling and soreness of both breasts.   No breast trauma.   Has breast implants.    Had insomnia and she took progesterone for a couple of nights to help her sleep.  Otherwise not taking it.   Periods are irregular: 9/18 - 9/27     GYNECOLOGIC HISTORY: Patient's last menstrual period was 01/02/2022 (exact date). Contraception:  Essure  Menopausal hormone therapy:  none  Last mammogram: [facility]. 03/21/21?  Last pap smear:   05-02-21 ASCUS:Pos HR HPV;06-21-21 ECC Neg.  Hx of LEEP revealing CIN II 2019        OB History     Gravida  6   Para  3   Term  2   Preterm  1   AB  3   Living  4      SAB  3   IAB      Ectopic      Multiple  1   Live Births  4              Patient Active Problem List   Diagnosis Date Noted   Anxiety 10/12/2021   Abnormal uterine bleeding (AUB) 10/12/2021   MDD (major depressive disorder) 10/12/2021    Past Medical History:  Diagnosis Date   Anxiety    Depression    Fibroid    History of abnormal cervical Pap smear    Hx LEEP procedure 2019   STD (sexually transmitted disease)    HR HPV    Past Surgical History:  Procedure Laterality Date   ABDOMINAL SURGERY     "tummy tuck"   AUGMENTATION MAMMAPLASTY     silicone breast implants   CERVICAL BIOPSY  W/ LOOP ELECTRODE EXCISION  05/31/2017   CESAREAN SECTION     COSMETIC SURGERY  10/2009 10/2013   ESSURE TUBAL LIGATION      No current outpatient medications on file.   No current facility-administered medications for this visit.     ALLERGIES: Wellbutrin [bupropion]  Family History  Problem Relation Age of Onset    Hypertension Mother    Diabetes Mother    Stroke Father    Hyperlipidemia Father    Coronary artery disease Father    Diabetes Brother    Breast cancer Paternal Grandmother 71    Social History   Socioeconomic History   Marital status: Married    Spouse name: Not on file   Number of children: Not on file   Years of education: Not on file   Highest education level: Not on file  Occupational History   Not on file  Tobacco Use   Smoking status: Former    Packs/day: 0.50    Years: 10.00    Total pack years: 5.00    Types: Cigarettes    Quit date: 09/25/2009    Years since quitting: 12.3   Smokeless tobacco: Never  Vaping Use   Vaping Use: Never used  Substance and Sexual Activity   Alcohol use: Never   Drug use: Never   Sexual activity: Yes    Birth  control/protection: Surgical    Comment: Essure/partner vas, first intercourse <16  Other Topics Concern   Not on file  Social History Narrative   Not on file   Social Determinants of Health   Financial Resource Strain: Not on file  Food Insecurity: Not on file  Transportation Needs: Not on file  Physical Activity: Not on file  Stress: Not on file  Social Connections: Not on file  Intimate Partner Violence: Not on file    Review of Systems  Musculoskeletal:        Right breast pain    PHYSICAL EXAMINATION:    BP 122/80 (BP Location: Right Arm, Patient Position: Sitting)   Pulse 82   Resp 18   LMP 01/02/2022 (Exact Date) Comment: Essure implants, spouse vasectomy  SpO2 99%     General appearance: alert, cooperative and appears stated age Head: Normocephalic, without obvious abnormality, atraumatic Neck: no adenopathy, supple, symmetrical, trachea midline and thyroid normal to inspection and palpation Lungs: clear to auscultation bilaterally Breasts: normal appearance, no masses or tenderness, No nipple retraction or dimpling, No nipple discharge or bleeding, No axillary or supraclavicular adenopathy Heart:  regular rate and rhythm Abdomen: soft, non-tender, no masses,  no organomegaly Extremities: extremities normal, atraumatic, no cyanosis or edema Skin: Skin color, texture, turgor normal. No rashes or lesions Lymph nodes: Cervical, supraclavicular, and axillary nodes normal. No abnormal inguinal nodes palpated Neurologic: Grossly normal  Pelvic: External genitalia:  no lesions              Urethra:  normal appearing urethra with no masses, tenderness or lesions              Bartholins and Skenes: normal                 Vagina: normal appearing vagina with normal color and discharge, no lesions              Cervix: no lesions                Bimanual Exam:  Uterus:  normal size, contour, position, consistency, mobility, non-tender              Adnexa: no mass, fullness, tenderness              Rectal exam: {yes no:[id number]}.  Confirms.              Anus:  normal sphincter tone, no lesions  Chaperone was present for exam:  ***  ASSESSMENT     PLAN     An After Visit Summary was printed and given to the patient.  ______ minutes face to face time of which over 50% was spent in counseling.

## 2022-01-30 ENCOUNTER — Ambulatory Visit (INDEPENDENT_AMBULATORY_CARE_PROVIDER_SITE_OTHER): Payer: 59 | Admitting: Obstetrics and Gynecology

## 2022-01-30 ENCOUNTER — Encounter: Payer: Self-pay | Admitting: Obstetrics and Gynecology

## 2022-01-30 VITALS — BP 122/80 | HR 82 | Resp 18

## 2022-01-30 DIAGNOSIS — N644 Mastodynia: Secondary | ICD-10-CM

## 2022-01-30 DIAGNOSIS — N921 Excessive and frequent menstruation with irregular cycle: Secondary | ICD-10-CM

## 2022-02-01 NOTE — Patient Instructions (Signed)
Breast Tenderness Breast tenderness is a common problem for women of all ages, but may also occur in men. Breast tenderness has many possible causes, including hormone changes, infections, taking certain medicines, and caffeine intake. In women, the pain usually comes and goes with the menstrual cycle, but it can also be constant. Breast tenderness may range from mild discomfort to severe pain. You may have tests, such as a mammogram or an ultrasound, to check for any unusual findings. Having breast tenderness usually does not mean that you have breast cancer. Follow these instructions at home: Managing pain and discomfort  If directed, put ice on the painful area. To do this: Put ice in a plastic bag. Place a towel between your skin and the bag. Leave the ice on for 20 minutes, 2-3 times a day. If your skin turns bright red, remove the ice right away to prevent skin damage. The risk of skin damage is higher if you cannot feel pain, heat, or cold. Wear a supportive bra or chest support: During exercise. While sleeping, if your breasts are very tender. Medicines Take over-the-counter and prescription medicines only as told by your health care provider. If the cause of your pain is an infection, you may be prescribed an antibiotic medicine. If you were prescribed antibiotics, take them as told by your health care provider. Do not stop using the antibiotic even if you start to feel better. Eating and drinking Decrease the amount of caffeine in your diet. Instead, drink more water and choose caffeine-free drinks. Your health care provider may recommend that you lessen the amount of fat in your diet. You can do this by: Limiting fried foods. Cooking foods using methods such as baking, boiling, grilling, and broiling. General instructions  Keep a log of the days and times when your breasts are most tender. Ask your health care provider how to do breast exams at home. This will help you notice if  you have an unusual growth or lump. Keep all follow-up visits. Contact a health care provider if: Any part of your breast is hard, red, and hot to the touch. This may be a sign of infection. You are a woman and have a new or painful lump in your breast that remains after your menstrual period ends. You are not breastfeeding and you have fluid, especially blood or pus, coming out of your nipples. You have a fever. Your pain does not improve or it gets worse. Your pain is interfering with your daily activities. Summary Breast tenderness may range from mild discomfort to severe pain. Breast tenderness has many possible causes, including hormone changes, infections, taking certain medicines, and caffeine intake. It can be treated with ice, wearing a supportive bra or chest support, and medicines. Make changes to your diet as told by your health care provider. This information is not intended to replace advice given to you by your health care provider. Make sure you discuss any questions you have with your health care provider. Document Revised: 06/15/2021 Document Reviewed: 06/15/2021 Elsevier Patient Education  2023 Elsevier Inc.  

## 2022-02-10 ENCOUNTER — Other Ambulatory Visit: Payer: Self-pay | Admitting: Obstetrics and Gynecology

## 2022-02-10 DIAGNOSIS — Z1231 Encounter for screening mammogram for malignant neoplasm of breast: Secondary | ICD-10-CM

## 2022-02-27 ENCOUNTER — Ambulatory Visit
Admission: RE | Admit: 2022-02-27 | Discharge: 2022-02-27 | Disposition: A | Discharge status: Home / Self Care | Payer: 59 | Source: Ambulatory Visit | Attending: Obstetrics and Gynecology | Admitting: Obstetrics and Gynecology

## 2022-02-27 DIAGNOSIS — Z1231 Encounter for screening mammogram for malignant neoplasm of breast: Secondary | ICD-10-CM

## 2022-04-24 NOTE — Progress Notes (Signed)
52 y.o. [id number] Married Caucasian female here for annual exam.   Just finishing her menstrual cycle.  Wants routine labs.  Had normal cholesterol through her husband's work.   Now her cycles are normal and back on schedule, last 7 - 8 days.  No pelvic pain.  No bleeding in between cycles.  Has long history of abnormal uterine bleeding and known fibroids.  Hx complex left ovarian cyst noted on pelvic US 10/05/21. CA125 was 18.    EMB done 10/20/21 showed benign secretory endometrium.   E2 130 and FSH 14.2 on 10/20/21.  She is off her HRT.   Follow up US 11/10/21: Pelvic US -  Uterus 9.10 x 6.53 x 4.81 cm.  Multiple fibroids:  intramural and subserosal.  0.92 cm, 1.54 cm, 1.36 cm, 1.02 cm, 0.95 cm, 1.05 cm.  EMS 4.76 mm.  Symmetrical.  Echogenic area at the fundus may extend through myometrium and outside the uterus. Possible Essure. (Not previously noted with pelvic US in June, 2023.) Left ovary 2.62 x 1.39 x 1.85 cm.  Right ovary 2.49 x 2.00 x 1.10 cm.  No adnexal masses.  Prominent vessels in the left adnexa. No free fluid.   PCP:   Josephine Igo, MD  Patient's last menstrual period was 04/30/2022.     Period Cycle (Days): 28 Period Duration (Days): 7-8 Period Pattern: Regular     Sexually active: Yes.    The current method of family planning is Essure/vasectomy.    Exercising: Yes.     Gym 2x week Smoker:  former  Health Maintenance: Pap:  05-02-21 ASCUS:Pos HR HPV;06-21-21 ECC Neg  History of abnormal Pap:  yes, Hx of LEEP revealing CIN II 2019  MMG:  02/27/22 - [place]. Colonoscopy:  2023 Cologuard normal BMD:   n/a  Result  n/a TDaP:  Pt does not remember Gardasil:   no Screening Labs: PCP   reports that she quit smoking about 12 years ago. Her smoking use included cigarettes. She has a 5.00 pack-year smoking history. She has never used smokeless tobacco. She reports that she does not drink alcohol and does not use drugs.  Past Medical History:   Diagnosis Date   Anxiety    Depression    Fibroid    History of abnormal cervical Pap smear    Hx LEEP procedure 2019   STD (sexually transmitted disease)    HR HPV    Past Surgical History:  Procedure Laterality Date   ABDOMINAL SURGERY     "tummy tuck"   AUGMENTATION MAMMAPLASTY     silicone breast implants   BREAST BIOPSY Right    CERVICAL BIOPSY  W/ LOOP ELECTRODE EXCISION  05/31/2017   CESAREAN SECTION     COSMETIC SURGERY  10/2009 10/2013   ESSURE TUBAL LIGATION      No current outpatient medications on file.   No current facility-administered medications for this visit.    Family History  Problem Relation Age of Onset   Hypertension Mother    Diabetes Mother    Stroke Father    Hyperlipidemia Father    Coronary artery disease Father    Diabetes Brother    Breast cancer Paternal Grandmother 51    Review of Systems  All other systems reviewed and are negative.   Exam:   BP 122/76 (BP Location: Right Arm, Patient Position: Sitting, Cuff Size: Normal)   Pulse 87   Ht '4\' 11"'$  (1.499 m)   Wt 146 lb (66.2 kg)  LMP 04/30/2022   SpO2 98%   BMI 29.49 kg/m     General appearance: alert, cooperative and appears stated age Head: normocephalic, without obvious abnormality, atraumatic Neck: no adenopathy, supple, symmetrical, trachea midline and thyroid normal to inspection and palpation Lungs: clear to auscultation bilaterally Breasts: consistent with bilateral augmentation, no masses or tenderness, No nipple retraction or dimpling, No nipple discharge or bleeding, No axillary adenopathy Heart: regular rate and rhythm Abdomen: soft, non-tender; no masses, no organomegaly Extremities: extremities normal, atraumatic, no cyanosis or edema Skin: skin color, texture, turgor normal. No rashes or lesions Lymph nodes: cervical, supraclavicular, and axillary nodes normal. Neurologic: grossly normal  Pelvic: External genitalia:  no lesions              No abnormal  inguinal nodes palpated.              Urethra:  normal appearing urethra with no masses, tenderness or lesions              Bartholins and Skenes: normal                 Vagina: normal appearing vagina with normal color and discharge, no lesions              Cervix: no lesions.  Light menstrual flow.              Pap taken: yes Bimanual Exam:  Uterus:  normal size, contour, position, consistency, mobility, non-tender              Adnexa: no mass, fullness, tenderness              Rectal exam: yes.  Confirms.              Anus:  normal sphincter tone, no lesions  Chaperone was present for exam:  Raquel Sarna  Assessment:   Well woman visit with gynecologic exam. Normalization of menstrual cycle. Hx LEEP CIN II.  Possible malposition of Essure, but not confirmed.  Will do observation.  Bilateral breast augmentation .  Plan: Mammogram screening discussed. Self breast awareness reviewed. Pap and HR HPV collected. Guidelines for Calcium, Vitamin D, regular exercise program including cardiovascular and weight bearing exercise. CMP, CBC.  Follow up annually and prn.   After visit summary provided.

## 2022-05-04 ENCOUNTER — Ambulatory Visit: Payer: 59 | Admitting: Obstetrics and Gynecology

## 2022-05-08 ENCOUNTER — Ambulatory Visit (INDEPENDENT_AMBULATORY_CARE_PROVIDER_SITE_OTHER): Payer: 59 | Admitting: Obstetrics and Gynecology

## 2022-05-08 ENCOUNTER — Encounter: Payer: Self-pay | Admitting: Obstetrics and Gynecology

## 2022-05-08 ENCOUNTER — Other Ambulatory Visit (HOSPITAL_COMMUNITY)
Admission: RE | Admit: 2022-05-08 | Discharge: 2022-05-08 | Disposition: A | Discharge status: Home / Self Care | Payer: 59 | Source: Ambulatory Visit | Attending: Obstetrics and Gynecology | Admitting: Obstetrics and Gynecology

## 2022-05-08 VITALS — BP 122/76 | HR 87 | Ht 59.0 in | Wt 146.0 lb

## 2022-05-08 DIAGNOSIS — Z124 Encounter for screening for malignant neoplasm of cervix: Secondary | ICD-10-CM | POA: Diagnosis present

## 2022-05-08 DIAGNOSIS — Z01419 Encounter for gynecological examination (general) (routine) without abnormal findings: Secondary | ICD-10-CM | POA: Diagnosis not present

## 2022-05-08 DIAGNOSIS — Z0000 Encounter for general adult medical examination without abnormal findings: Secondary | ICD-10-CM

## 2022-05-08 NOTE — Patient Instructions (Signed)

## 2022-05-09 LAB — COMPREHENSIVE METABOLIC PANEL
AG Ratio: 1.8 (calc) (ref 1.0–2.5)
ALT: 13 U/L (ref 6–29)
AST: 16 U/L (ref 10–35)
Albumin: 4.5 g/dL (ref 3.6–5.1)
Alkaline phosphatase (APISO): 52 U/L (ref 37–153)
BUN: 16 mg/dL (ref 7–25)
CO2: 26 mmol/L (ref 20–32)
Calcium: 9.6 mg/dL (ref 8.6–10.4)
Chloride: 107 mmol/L (ref 98–110)
Creat: 0.78 mg/dL (ref 0.50–1.03)
Globulin: 2.5 g/dL (calc) (ref 1.9–3.7)
Glucose, Bld: 94 mg/dL (ref 65–99)
Potassium: 5.2 mmol/L (ref 3.5–5.3)
Sodium: 143 mmol/L (ref 135–146)
Total Bilirubin: 0.7 mg/dL (ref 0.2–1.2)
Total Protein: 7 g/dL (ref 6.1–8.1)

## 2022-05-09 LAB — CBC
HCT: 38.8 % (ref 35.0–45.0)
Hemoglobin: 13.2 g/dL (ref 11.7–15.5)
MCH: 30.1 pg (ref 27.0–33.0)
MCHC: 34 g/dL (ref 32.0–36.0)
MCV: 88.6 fL (ref 80.0–100.0)
MPV: 10 fL (ref 7.5–12.5)
Platelets: 359 10*3/uL (ref 140–400)
RBC: 4.38 10*6/uL (ref 3.80–5.10)
RDW: 12 % (ref 11.0–15.0)
WBC: 7.4 10*3/uL (ref 3.8–10.8)

## 2022-05-12 LAB — CYTOLOGY - PAP
Comment: NEGATIVE
High risk HPV: POSITIVE — AB

## 2022-05-17 ENCOUNTER — Other Ambulatory Visit: Payer: Self-pay

## 2022-05-17 DIAGNOSIS — R87612 Low grade squamous intraepithelial lesion on cytologic smear of cervix (LGSIL): Secondary | ICD-10-CM

## 2022-06-01 NOTE — Progress Notes (Signed)
GYNECOLOGY  VISIT   HPI: 52 y.o.   Married  Caucasian  female   [phone] with Patient's last menstrual period was 06/07/2022.   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  Frustrated with continued abnormal paps and HPV.  UPT neg.  Her father is going in to a nursing home.  Her mother will be coming to [place].   GYNECOLOGIC HISTORY: Patient's last menstrual period was 06/07/2022. Contraception:  essure/vasectomy Menopausal hormone therapy:  n/a Last mammogram:  03/19/20, normal Last pap smear:   05/08/22 LSIL: HR HPV positive        OB History     Gravida  6   Para  3   Term  2   Preterm  1   AB  3   Living  4      SAB  3   IAB      Ectopic      Multiple  1   Live Births  4              Patient Active Problem List   Diagnosis Date Noted   Anxiety 10/12/2021   Abnormal uterine bleeding (AUB) 10/12/2021   MDD (major depressive disorder) 10/12/2021   Ovarian cyst 09/26/2021    Past Medical History:  Diagnosis Date   Anxiety    Depression    Fibroid    History of abnormal cervical Pap smear    Hx LEEP procedure 2019   STD (sexually transmitted disease)    HR HPV    Past Surgical History:  Procedure Laterality Date   ABDOMINAL SURGERY     "tummy tuck"   AUGMENTATION MAMMAPLASTY     silicone breast implants   BREAST BIOPSY Right    CERVICAL BIOPSY  W/ LOOP ELECTRODE EXCISION  05/31/2017   CESAREAN SECTION     COSMETIC SURGERY  10/2009 10/2013   ESSURE TUBAL LIGATION      No current outpatient medications on file.   No current facility-administered medications for this visit.     ALLERGIES: Wellbutrin [bupropion]  Family History  Problem Relation Age of Onset   Hypertension Mother    Diabetes Mother    Stroke Father    Hyperlipidemia Father    Coronary artery disease Father    Diabetes Brother    Breast cancer Paternal Grandmother 17    Social History   Socioeconomic History   Marital  status: Married    Spouse name: Not on file   Number of children: Not on file   Years of education: Not on file   Highest education level: Not on file  Occupational History   Not on file  Tobacco Use   Smoking status: Former    Packs/day: 0.50    Years: 10.00    Total pack years: 5.00    Types: Cigarettes    Quit date: 09/25/2009    Years since quitting: 12.7   Smokeless tobacco: Never  Vaping Use   Vaping Use: Never used  Substance and Sexual Activity   Alcohol use: Never   Drug use: Never   Sexual activity: Yes    Birth control/protection: Surgical    Comment: Essure/partner vas, 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   PHYSICAL EXAMINATION:    BP 124/76 (BP Location: Right Arm, Patient Position: Sitting, Cuff Size: Normal)   Pulse 64   Ht '4\' 11"'$  (1.499 m)   Wt 150 lb (68 kg)   LMP 06/07/2022   SpO2 100%   BMI 30.30 kg/m     General appearance: alert, cooperative and appears stated age   Colposcopy - cervix, vagina. Consent for procedure.  3% acetic acid used in vagina. White light and green light filter used.  Colposcopy satisfactory:  Yes   ___x__          No    _____ Findings:    Cervix:  small island of acetowhite change at 11:00 just at transformation zone.   Vagina:  no lesions.  Biopsies:  biopsy at 11:00, ECC - both to pathology separately.  Monsel's placed.  Minimal EBL. No complications.   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  Positive HR HPV Hx LEEP 2019 - CIN II, negative margins.  Vasectomy and Essure for  contraception.  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  Doing observation of this.   PLAN  FU biopsy results.  Final plan to follow.    An After Visit Summary was printed and given to the patient.

## 2022-06-15 ENCOUNTER — Ambulatory Visit (INDEPENDENT_AMBULATORY_CARE_PROVIDER_SITE_OTHER): Payer: 59 | Admitting: Obstetrics and Gynecology

## 2022-06-15 ENCOUNTER — Encounter: Payer: Self-pay | Admitting: Obstetrics and Gynecology

## 2022-06-15 ENCOUNTER — Other Ambulatory Visit (HOSPITAL_COMMUNITY)
Admission: RE | Admit: 2022-06-15 | Discharge: 2022-06-15 | Disposition: A | Discharge status: Home / Self Care | Payer: 59 | Source: Ambulatory Visit | Attending: Obstetrics and Gynecology | Admitting: Obstetrics and Gynecology

## 2022-06-15 VITALS — BP 124/76 | HR 64 | Ht 59.0 in | Wt 150.0 lb

## 2022-06-15 DIAGNOSIS — R87612 Low grade squamous intraepithelial lesion on cytologic smear of cervix (LGSIL): Secondary | ICD-10-CM

## 2022-06-15 DIAGNOSIS — Z01812 Encounter for preprocedural laboratory examination: Secondary | ICD-10-CM

## 2022-06-15 DIAGNOSIS — R87810 Cervical high risk human papillomavirus (HPV) DNA test positive: Secondary | ICD-10-CM | POA: Insufficient documentation

## 2022-06-15 LAB — PREGNANCY, URINE: Preg Test, Ur: NEGATIVE

## 2022-06-15 NOTE — Patient Instructions (Signed)
Colposcopy, Care After  The following information offers guidance on how to care for yourself after your procedure. Your health care provider may also give you more specific instructions. If you have problems or questions, contact your health care provider. What can I expect after the procedure? If you had a colposcopy without a biopsy, you can expect to feel fine right away after your procedure. However, you may have some spotting of blood for a few days. You can return to your normal activities. If you had a colposcopy with a biopsy, it is common after the procedure to have: Soreness and mild pain. These may last for a few days. Mild vaginal bleeding or discharge that is dark-colored and grainy. This may last for a few days. The discharge may be caused by a liquid (solution) that was used during the procedure. You may need to wear a sanitary pad during this time. Spotting of blood for at least 48 hours after the procedure. Follow these instructions at home: Medicines Take over-the-counter and prescription medicines only as told by your health care provider. Talk with your health care provider about what type of over-the-counter pain medicines and prescription medicines you can start to take again. It is especially important to talk with your health care provider if you take blood thinners. Activity Avoid using douche products, using tampons, and having sex for at least 3 days after the procedure or for as long as told by your health care provider. Return to your normal activities as told by your health care provider. Ask your health care provider what activities are safe for you. General instructions Ask your health care provider if you may take baths, swim, or use a hot tub. You may take showers. If you use birth control (contraception), continue to use it. Keep all follow-up visits. This is important. Contact a health care provider if: You have a fever or chills. You faint or feel  light-headed. Get help right away if: You have heavy bleeding from your vagina or pass blood clots. Heavy bleeding is bleeding that soaks through a sanitary pad in less than 1 hour. You have vaginal discharge that is abnormal, is yellow in color, or smells bad. This could be a sign of infection. You have severe pain or cramps in your lower abdomen that do not go away with medicine. Summary If you had a colposcopy without a biopsy, you can expect to feel fine right away, but you may have some spotting of blood for a few days. You can return to your normal activities. If you had a colposcopy with a biopsy, it is common to have mild pain for a few days and spotting for 48 hours after the procedure. Avoid using douche products, using tampons, and having sex for at least 3 days after the procedure or for as long as told by your health care provider. Get help right away if you have heavy bleeding, severe pain, or signs of infection. This information is not intended to replace advice given to you by your health care provider. Make sure you discuss any questions you have with your health care provider. Document Revised: 08/29/2020 Document Reviewed: 08/29/2020 Elsevier Patient Education  Media.

## 2022-06-19 LAB — SURGICAL PATHOLOGY

## 2022-06-21 ENCOUNTER — Other Ambulatory Visit: Payer: Self-pay

## 2022-06-21 DIAGNOSIS — R87612 Low grade squamous intraepithelial lesion on cytologic smear of cervix (LGSIL): Secondary | ICD-10-CM

## 2022-06-21 DIAGNOSIS — R87810 Cervical high risk human papillomavirus (HPV) DNA test positive: Secondary | ICD-10-CM

## 2022-09-06 NOTE — Progress Notes (Unsigned)
GYNECOLOGY  VISIT   HPI: 52 y.o.   Married  Caucasian  female   [phone] with No LMP recorded.   here for   bleeding  GYNECOLOGIC HISTORY: No LMP recorded. Contraception:  essure/vasectomy Menopausal hormone therapy:  n/a Last mammogram:  03/19/20 normal Last pap smear:   05/08/22 LSIL: HR HPV positive         OB History     Gravida  6   Para  3   Term  2   Preterm  1   AB  3   Living  4      SAB  3   IAB      Ectopic      Multiple  1   Live Births  4              Patient Active Problem List   Diagnosis Date Noted   Anxiety 10/12/2021   Abnormal uterine bleeding (AUB) 10/12/2021   MDD (major depressive disorder) 10/12/2021   Ovarian cyst 09/26/2021    Past Medical History:  Diagnosis Date   Anxiety    Depression    Fibroid    History of abnormal cervical Pap smear    Hx LEEP procedure 2019   STD (sexually transmitted disease)    HR HPV    Past Surgical History:  Procedure Laterality Date   ABDOMINAL SURGERY     "tummy tuck"   AUGMENTATION MAMMAPLASTY     silicone breast implants   BREAST BIOPSY Right    CERVICAL BIOPSY  W/ LOOP ELECTRODE EXCISION  05/31/2017   CESAREAN SECTION     COSMETIC SURGERY  10/2009 10/2013   ESSURE TUBAL LIGATION      No current outpatient medications on file.   No current facility-administered medications for this visit.     ALLERGIES: Wellbutrin [bupropion]  Family History  Problem Relation Age of Onset   Hypertension Mother    Diabetes Mother    Stroke Father    Hyperlipidemia Father    Coronary artery disease Father    Diabetes Brother    Breast cancer Paternal Grandmother 39    Social History   Socioeconomic History   Marital status: Married    Spouse name: Not on file   Number of children: Not on file   Years of education: Not on file   Highest education level: Not on file  Occupational History   Not on file  Tobacco Use   Smoking status: Former    Packs/day: 0.50    Years: 10.00     Additional pack years: 0.00    Total pack years: 5.00    Types: Cigarettes    Quit date: 09/25/2009    Years since quitting: 12.9   Smokeless tobacco: Never  Vaping Use   Vaping Use: Never used  Substance and Sexual Activity   Alcohol use: Never   Drug use: Never   Sexual activity: Yes    Birth control/protection: Surgical    Comment: Essure/partner vas, first intercourse <16  Other Topics Concern   Not on file  Social History Narrative   Not on file   Social Determinants of Health   Financial Resource Strain: Not on file  Food Insecurity: Not on file  Transportation Needs: Not on file  Physical Activity: Not on file  Stress: Not on file  Social Connections: Not on file  Intimate Partner Violence: Not on file    Review of Systems  PHYSICAL EXAMINATION:    There were  no vitals taken for this visit.    General appearance: alert, cooperative and appears stated age Head: Normocephalic, without obvious abnormality, atraumatic Neck: no adenopathy, supple, symmetrical, trachea midline and thyroid normal to inspection and palpation Lungs: clear to auscultation bilaterally Breasts: normal appearance, no masses or tenderness, No nipple retraction or dimpling, No nipple discharge or bleeding, No axillary or supraclavicular adenopathy Heart: regular rate and rhythm Abdomen: soft, non-tender, no masses,  no organomegaly Extremities: extremities normal, atraumatic, no cyanosis or edema Skin: Skin color, texture, turgor normal. No rashes or lesions Lymph nodes: Cervical, supraclavicular, and axillary nodes normal. No abnormal inguinal nodes palpated Neurologic: Grossly normal  Pelvic: External genitalia:  no lesions              Urethra:  normal appearing urethra with no masses, tenderness or lesions              Bartholins and Skenes: normal                 Vagina: normal appearing vagina with normal color and discharge, no lesions              Cervix: no lesions                 Bimanual Exam:  Uterus:  normal size, contour, position, consistency, mobility, non-tender              Adnexa: no mass, fullness, tenderness              Rectal exam: {yes no:314532}.  Confirms.              Anus:  normal sphincter tone, no lesions  Chaperone was present for exam:  ***  ASSESSMENT     PLAN     An After Visit Summary was printed and given to the patient.  ______ minutes face to face time of which over 50% was spent in counseling.

## 2022-09-07 ENCOUNTER — Encounter: Payer: Self-pay | Admitting: Obstetrics and Gynecology

## 2022-09-07 ENCOUNTER — Ambulatory Visit (INDEPENDENT_AMBULATORY_CARE_PROVIDER_SITE_OTHER): Payer: 59 | Admitting: Obstetrics and Gynecology

## 2022-09-07 ENCOUNTER — Other Ambulatory Visit: Payer: Self-pay | Admitting: Obstetrics and Gynecology

## 2022-09-07 VITALS — BP 138/80 | HR 100 | Ht 59.0 in | Wt 150.0 lb

## 2022-09-07 DIAGNOSIS — D219 Benign neoplasm of connective and other soft tissue, unspecified: Secondary | ICD-10-CM

## 2022-09-07 DIAGNOSIS — N939 Abnormal uterine and vaginal bleeding, unspecified: Secondary | ICD-10-CM | POA: Diagnosis not present

## 2022-09-07 DIAGNOSIS — R7989 Other specified abnormal findings of blood chemistry: Secondary | ICD-10-CM

## 2022-09-07 LAB — CBC
HCT: 41.1 % (ref 35.0–45.0)
MCV: 89.3 fL (ref 80.0–100.0)
RDW: 12.4 % (ref 11.0–15.0)
WBC: 9.4 10*3/uL (ref 3.8–10.8)

## 2022-09-07 LAB — PREGNANCY, URINE: Preg Test, Ur: NEGATIVE

## 2022-09-07 MED ORDER — MEDROXYPROGESTERONE ACETATE 10 MG PO TABS
10.0000 mg | ORAL_TABLET | Freq: Every day | ORAL | 0 refills | Status: DC
Start: 2022-09-07 — End: 2023-03-06

## 2022-09-07 NOTE — Patient Instructions (Signed)
Total Laparoscopic Hysterectomy A total laparoscopic hysterectomy is a minimally invasive surgery to remove the uterus and cervix. The fallopian tubes and ovaries can also be removed during this surgery, if necessary. This procedure may be done to treat problems such as: Growths in the uterus (uterine fibroids) that are not cancer but cause symptoms. A condition that causes the lining of the uterus to grow in other areas (endometriosis). Problems with pelvic support. Cancer of the cervix, ovaries, uterus, or tissue that lines the uterus (endometrium). Excessive bleeding in the uterus. After this procedure, you will no longer be able to have a baby, and you will no longer have a menstrual period. Tell a health care provider about: Any allergies you have. All medicines you are taking, including vitamins, herbs, eye drops, creams, and over-the-counter medicines. Any problems you or family members have had with anesthetic medicines. Any blood disorders you have. Any surgeries you have had. Any medical conditions you have. Whether you are pregnant or may be pregnant. What are the risks? Generally, this is a safe procedure. However, problems may occur, including: Infection. Bleeding. Blood clots in the legs or lungs. Allergic reactions to medicines. Damage to nearby structures or organs. Having to change from this surgery to one in which a large incision is made in the abdomen (abdominal hysterectomy). What happens before the procedure? Staying hydrated Follow instructions from your health care provider about hydration, which may include: Up to 2 hours before the procedure - you may continue to drink clear liquids, such as water, clear fruit juice, black coffee, and plain tea.  Eating and drinking restrictions Follow instructions from your health care provider about eating and drinking, which may include: 8 hours before the procedure - stop eating heavy meals or foods, such as meat, fried  foods, or fatty foods. 6 hours before the procedure - stop eating light meals or foods, such as toast or cereal. 6 hours before the procedure - stop drinking milk or drinks that contain milk. 2 hours before the procedure - stop drinking clear liquids. Medicines Take over-the-counter and prescription medicines only as told by your health care provider. You may be asked to take medicine that helps you have a bowel movement (laxative) to prevent constipation. General instructions If you were asked to do bowel preparation before the procedure, follow instructions from your health care provider. This procedure can affect the way you feel about yourself. Talk with your health care provider about the physical and emotional changes hysterectomy may cause. Do not use any products that contain nicotine or tobacco for at least 4 weeks before the procedure. These products include cigarettes, chewing tobacco, and vaping devices, such as e-cigarettes. If you need help quitting, ask your health care provider. Plan to have a responsible adult take you home from the hospital or clinic. Plan to have a responsible adult care for you for the time you are told after you leave the hospital or clinic. This is important. Surgery safety Ask your health care provider: How your surgery site will be marked. What steps will be taken to help prevent infection. These may include: Removing hair at the surgery site. Washing skin with a germ-killing soap. Receiving antibiotic medicine. What happens during the procedure? An IV will be inserted into one of your veins. You will be given one or more of the following: A medicine to help you relax (sedative). A medicine to make you fall asleep (general anesthetic). A medicine to numb the area (local anesthetic). A medicine   that is injected into your spine to numb the area below and slightly above the injection site (spinal anesthetic). A medicine that is injected into an area of  your body to numb everything below the injection site (regional anesthetic). A gas will be used to inflate your abdomen. This will allow your surgeon to look inside your abdomen and do the surgery. Three or four small incisions will be made in your abdomen. A small device with a light (laparoscope) will be inserted into one of your incisions. Surgical instruments will be inserted through the other incisions in order to perform the procedure. Your uterus and cervix may be removed through your vagina or cut into small pieces and removed through the small incisions. Any other organs that need to be removed will also be removed this way. The gas will be released from inside your abdomen. Your incisions will be closed with stitches (sutures), skin glue, or adhesive strips. A bandage (dressing) may be placed over your incisions. The procedure may vary among health care providers and hospitals. What happens after the procedure? Your blood pressure, heart rate, breathing rate, and blood oxygen level will be monitored until you leave the hospital or clinic. You will be given medicine for pain as needed. You will be encouraged to walk as soon as possible. You will also use a device to help you breathe or do breathing exercises to keep your lungs clear. You may have to wear compression stockings. These stockings help to prevent blood clots and reduce swelling in your legs. You will need to wear a sanitary pad for vaginal discharge or bleeding. Summary Total laparoscopic hysterectomy is a procedure to remove your uterus, cervix, and sometimes the fallopian tubes and ovaries. This procedure can affect the way you feel about yourself. Talk with your health care provider about the physical and emotional changes hysterectomy may cause. After this procedure, you will no longer be able to have a baby, and you will no longer have a menstrual period. You will be given pain medicine to control discomfort after this  procedure. Plan to have a responsible adult take you home from the hospital or clinic. This information is not intended to replace advice given to you by your health care provider. Make sure you discuss any questions you have with your health care provider. Document Revised: 06/15/2021 Document Reviewed: 12/05/2019 Elsevier Patient Education  2023 [employer]  

## 2022-09-07 NOTE — Progress Notes (Signed)
Future order for CBC.

## 2022-09-08 LAB — CBC
Hemoglobin: 13.7 g/dL (ref 11.7–15.5)
MCH: 29.8 pg (ref 27.0–33.0)
MCHC: 33.3 g/dL (ref 32.0–36.0)
MPV: 9.6 fL (ref 7.5–12.5)
Platelets: 421 10*3/uL — ABNORMAL HIGH (ref 140–400)
RBC: 4.6 10*6/uL (ref 3.80–5.10)

## 2022-09-08 LAB — TSH: TSH: 0.75 mIU/L

## 2022-09-27 NOTE — Progress Notes (Deleted)
GYNECOLOGY  VISIT   HPI: 52 y.o.   Married  Caucasian  female   Sierra Cantrell with No LMP recorded.   here for   U/S consult  GYNECOLOGIC HISTORY: No LMP recorded. Contraception:  essure/vasectomy Menopausal hormone therapy:  n/a Last mammogram:  03/19/20 normal Last pap smear:   05/08/22 LSIL: HR HPV positive.  Colposcopy 2/292/4 - ECC benign,  biopsy 11:00 L[facility]IL.         OB History     Gravida  6   Para  3   Term  2   Preterm  1   AB  3   Living  4      SAB  3   IAB      Ectopic      Multiple  1   Live Births  4              Patient Active Problem List   Diagnosis Date Noted   Anxiety 10/12/2021   Abnormal uterine bleeding (AUB) 10/12/2021   MDD (major depressive disorder) 10/12/2021   Ovarian cyst 09/26/2021    Past Medical History:  Diagnosis Date   Anxiety    Depression    Fibroid    History of abnormal cervical Pap smear    Hx LEEP procedure 2019   STD (sexually transmitted disease)    HR HPV    Past Surgical History:  Procedure Laterality Date   ABDOMINAL SURGERY     "tummy tuck"   AUGMENTATION MAMMAPLASTY     silicone breast implants   BREAST BIOPSY Right    CERVICAL BIOPSY  W/ LOOP ELECTRODE EXCISION  05/31/2017   CESAREAN SECTION     COSMETIC SURGERY  10/2009 10/2013   ESSURE TUBAL LIGATION      Current Outpatient Medications  Medication Sig Dispense Refill   medroxyPROGESTERone (PROVERA) 10 MG tablet Take 1 tablet (10 mg total) by mouth daily. Take one by mouth daily for 10 days. 10 tablet 0   No current facility-administered medications for this visit.     ALLERGIES: Wellbutrin [bupropion]  Family History  Problem Relation Age of Onset   Hypertension Mother    Diabetes Mother    Stroke Father    Hyperlipidemia Father    Coronary artery disease Father    Diabetes Brother    Breast cancer Paternal Grandmother 52    Social History   Socioeconomic History   Marital status: Married    Spouse name: Not on file    Number of children: Not on file   Years of education: Not on file   Highest education level: Not on file  Occupational History   Not on file  Tobacco Use   Smoking status: Former    Packs/day: 0.50    Years: 10.00    Additional pack years: 0.00    Total pack years: 5.00    Types: Cigarettes    Quit date: 09/25/2009    Years since quitting: 13.0   Smokeless tobacco: Never  Vaping Use   Vaping Use: Never used  Substance and Sexual Activity   Alcohol use: Never   Drug use: Never   Sexual activity: Yes    Birth control/protection: Surgical    Comment: Essure/partner vas, first intercourse <16  Other Topics Concern   Not on file  Social History Narrative   Not on file   Social Determinants of Health   Financial Resource Strain: Not on file  Food Insecurity: Not on file  Transportation Needs: Not  on file  Physical Activity: Not on file  Stress: Not on file  Social Connections: Not on file  Intimate Partner Violence: Not on file    Review of Systems  PHYSICAL EXAMINATION:    There were no vitals taken for this visit.    General appearance: alert, cooperative and appears stated age Head: Normocephalic, without obvious abnormality, atraumatic Neck: no adenopathy, supple, symmetrical, trachea midline and thyroid normal to inspection and palpation Lungs: clear to auscultation bilaterally Breasts: normal appearance, no masses or tenderness, No nipple retraction or dimpling, No nipple discharge or bleeding, No axillary or supraclavicular adenopathy Heart: regular rate and rhythm Abdomen: soft, non-tender, no masses,  no organomegaly Extremities: extremities normal, atraumatic, no cyanosis or edema Skin: Skin color, texture, turgor normal. No rashes or lesions Lymph nodes: Cervical, supraclavicular, and axillary nodes normal. No abnormal inguinal nodes palpated Neurologic: Grossly normal  Pelvic: External genitalia:  no lesions              Urethra:  normal appearing  urethra with no masses, tenderness or lesions              Bartholins and Skenes: normal                 Vagina: normal appearing vagina with normal color and discharge, no lesions              Cervix: no lesions                Bimanual Exam:  Uterus:  normal size, contour, position, consistency, mobility, non-tender              Adnexa: no mass, fullness, tenderness              Rectal exam: {yes no:314532}.  Confirms.              Anus:  normal sphincter tone, no lesions  Chaperone was present for exam:  ***  ASSESSMENT     PLAN     An After Visit Summary was printed and given to the patient.  ______ minutes face to face time of which over 50% was spent in counseling.

## 2022-10-10 ENCOUNTER — Ambulatory Visit (INDEPENDENT_AMBULATORY_CARE_PROVIDER_SITE_OTHER): Payer: 59

## 2022-10-10 ENCOUNTER — Other Ambulatory Visit: Payer: 59 | Admitting: Obstetrics and Gynecology

## 2022-10-10 ENCOUNTER — Other Ambulatory Visit: Payer: 59

## 2022-10-10 DIAGNOSIS — N939 Abnormal uterine and vaginal bleeding, unspecified: Secondary | ICD-10-CM

## 2022-10-10 DIAGNOSIS — D219 Benign neoplasm of connective and other soft tissue, unspecified: Secondary | ICD-10-CM

## 2022-10-16 ENCOUNTER — Ambulatory Visit
Admission: RE | Admit: 2022-10-16 | Discharge: 2022-10-16 | Disposition: A | Discharge status: Home / Self Care | Payer: 59 | Source: Ambulatory Visit | Attending: Obstetrics & Gynecology | Admitting: Obstetrics & Gynecology

## 2022-10-16 ENCOUNTER — Other Ambulatory Visit: Payer: Self-pay | Admitting: Obstetrics & Gynecology

## 2022-10-16 DIAGNOSIS — T193XXA Foreign body in uterus, initial encounter: Secondary | ICD-10-CM

## 2022-11-02 ENCOUNTER — Telehealth: Payer: Self-pay

## 2022-11-02 NOTE — Telephone Encounter (Signed)
Pt LVM in triage line requesting [place] results from in-office [place] on 10/10/2022. Pt f/u visit w/ BS after [place] was cancelled but not r/s.   Please advise.

## 2022-11-02 NOTE — Telephone Encounter (Signed)
I reviewed Sierra Cantrell's ultrasound images.  She has 6 fibroids noted, each about 1 to 2 cm in size. The lining of her uterus looks unremarkable.  Her ovaries look normal. There was no comment about Essure or Essure migration.   I cannot confirm if this is occurring based on the images of this ultrasound.   How is her bleeding? Is she ready for referral for hysterectomy or does she want other treatment options?

## 2022-11-03 NOTE — Telephone Encounter (Signed)
Pt notified and voiced understanding. Will route to provider for final review and close.  

## 2022-11-03 NOTE — Telephone Encounter (Signed)
Pt reports LMP: 4/27-5/26.  Pt states that after taking provera, her bleeding stopped within 3 days and has not had any bleeding since.   Pt states that she is planning to have hysterectomy with Dr. Huel Coventry in [place]. States that he is one of the top recommended providers in the country to see for hyst/removal of the Essure. States she has done some research that indicated that the microfibers that are within the coils can be released into the body if not removed correctly and can potentially be poisonous/fatal. She stated that he plans to leave ovaries if all looks well.   Will follow up with [place] after she returns whether it is for her post op or after per recommendations from surgeon.   Pt wanted to inquire about f/u from recent CBC and elevated platelets from 09/07/2022? Wanted to know if that is something you wanted to repeat for her or she reported the surgeon also plans to do a lot of pre op labs prior to surgery.  Please advise.

## 2022-11-03 NOTE — Telephone Encounter (Signed)
Her platelets will need to be rechecked.    I have already placed a future order, so she can make a lab visit if she would like [place] to do this here.  I do expect that her surgeon will also be checking her blood counts in preparation for surgery.   I wish her a successful surgery and a quick recovery!

## 2022-11-06 ENCOUNTER — Other Ambulatory Visit: Payer: 59

## 2022-11-06 DIAGNOSIS — R7989 Other specified abnormal findings of blood chemistry: Secondary | ICD-10-CM

## 2022-11-06 LAB — CBC
HCT: 39.7 % (ref 35.0–45.0)
Hemoglobin: 13.3 g/dL (ref 11.7–15.5)
MCH: 29.2 pg (ref 27.0–33.0)
MCHC: 33.5 g/dL (ref 32.0–36.0)
MCV: 87.3 fL (ref 80.0–100.0)
MPV: 9.8 fL (ref 7.5–12.5)
Platelets: 355 10*3/uL (ref 140–400)
RBC: 4.55 10*6/uL (ref 3.80–5.10)
RDW: 12.2 % (ref 11.0–15.0)
WBC: 6.3 10*3/uL (ref 3.8–10.8)

## 2022-11-27 ENCOUNTER — Telehealth: Payer: Self-pay | Admitting: *Deleted

## 2022-11-27 LAB — LAB REPORT - SCANNED: EGFR: 105

## 2022-11-27 NOTE — Telephone Encounter (Signed)
Call returned to patient. Patient states she was notified of benefits for colpo, has canceled colpo, scheduled for Hysterectomy in [place] on 11/28/22 with Dr. Huel Coventry. States she has tele med f/u at 2 wk and 6wk post op. States she was advised by surgeon to go ahead and cancel colposcopy. She will be returning home on 11/30/22  Patient asking about next steps in care. Advised I will forward to Dr. Edward Jolly for review and f/u, patient agreeable.   Dr. Edward Jolly -please review and advise.

## 2022-11-28 HISTORY — PX: OTHER SURGICAL HISTORY: SHX169

## 2022-11-28 NOTE — Telephone Encounter (Signed)
Patient's gynecologist in in [place] will need to make recommendations regarding paps once her surgery is complete and the final surgical pathology specimen is reviewed by her doctor there.   I do recommend ongoing annual well woman visits.   I wish her a successful surgery and a quick recovery!

## 2022-11-29 LAB — LAB REPORT - SCANNED: EGFR: 105

## 2022-11-29 NOTE — Telephone Encounter (Signed)
Spoke with patient. Advised per Dr. Edward Jolly. Patient will f/u with [facility] once post op care completed to provide update. Desires to have Dr. Edward Jolly continue GYN care.  Patient is doing well following her surgery and appreciative of call.   Routing to provider for final review. Patient is agreeable to disposition. Will close encounter.

## 2022-12-13 ENCOUNTER — Encounter: Payer: Self-pay | Admitting: Obstetrics and Gynecology

## 2022-12-13 NOTE — Telephone Encounter (Signed)
CC: Jerilynn Mages  Please print and place in Dr. Rica Records box for review.   Thank you.

## 2022-12-21 ENCOUNTER — Ambulatory Visit: Payer: 59 | Admitting: Obstetrics and Gynecology

## 2023-01-03 ENCOUNTER — Encounter: Payer: Self-pay | Admitting: Obstetrics and Gynecology

## 2023-01-17 ENCOUNTER — Ambulatory Visit (INDEPENDENT_AMBULATORY_CARE_PROVIDER_SITE_OTHER): Payer: 59 | Admitting: Family Medicine

## 2023-01-17 ENCOUNTER — Encounter: Payer: Self-pay | Admitting: Family Medicine

## 2023-01-17 VITALS — BP 118/74 | HR 67 | Temp 98.7°F | Ht 59.0 in | Wt 156.4 lb

## 2023-01-17 DIAGNOSIS — E66811 Obesity, class 1: Secondary | ICD-10-CM

## 2023-01-17 DIAGNOSIS — Z1211 Encounter for screening for malignant neoplasm of colon: Secondary | ICD-10-CM

## 2023-01-17 DIAGNOSIS — E559 Vitamin D deficiency, unspecified: Secondary | ICD-10-CM | POA: Diagnosis not present

## 2023-01-17 DIAGNOSIS — Z0000 Encounter for general adult medical examination without abnormal findings: Secondary | ICD-10-CM

## 2023-01-17 DIAGNOSIS — F419 Anxiety disorder, unspecified: Secondary | ICD-10-CM

## 2023-01-17 DIAGNOSIS — E6609 Other obesity due to excess calories: Secondary | ICD-10-CM | POA: Diagnosis not present

## 2023-01-17 DIAGNOSIS — Z0279 Encounter for issue of other medical certificate: Secondary | ICD-10-CM

## 2023-01-17 DIAGNOSIS — F331 Major depressive disorder, recurrent, moderate: Secondary | ICD-10-CM

## 2023-01-17 DIAGNOSIS — Z0001 Encounter for general adult medical examination with abnormal findings: Secondary | ICD-10-CM

## 2023-01-17 DIAGNOSIS — Z6831 Body mass index (BMI) 31.0-31.9, adult: Secondary | ICD-10-CM | POA: Diagnosis not present

## 2023-01-17 DIAGNOSIS — M2560 Stiffness of unspecified joint, not elsewhere classified: Secondary | ICD-10-CM

## 2023-01-17 DIAGNOSIS — Z90710 Acquired absence of both cervix and uterus: Secondary | ICD-10-CM

## 2023-01-17 DIAGNOSIS — Z1159 Encounter for screening for other viral diseases: Secondary | ICD-10-CM

## 2023-01-17 LAB — HIGH SENSITIVITY CRP: CRP, High Sensitivity: 1.84 mg/L (ref 0.000–5.000)

## 2023-01-17 LAB — COMPREHENSIVE METABOLIC PANEL
ALT: 18 U/L (ref 0–35)
AST: 16 U/L (ref 0–37)
Albumin: 4.6 g/dL (ref 3.5–5.2)
Alkaline Phosphatase: 59 U/L (ref 39–117)
BUN: 14 mg/dL (ref 6–23)
CO2: 27 meq/L (ref 19–32)
Calcium: 9.9 mg/dL (ref 8.4–10.5)
Chloride: 103 meq/L (ref 96–112)
Creatinine, Ser: 0.8 mg/dL (ref 0.40–1.20)
GFR: 85.09 mL/min (ref >60.00)
Glucose, Bld: 101 mg/dL — ABNORMAL HIGH (ref 70–99)
Potassium: 4 meq/L (ref 3.5–5.1)
Sodium: 140 meq/L (ref 135–145)
Total Bilirubin: 0.9 mg/dL (ref 0.2–1.2)
Total Protein: 7.3 g/dL (ref 6.0–8.3)

## 2023-01-17 LAB — LIPID PANEL
Cholesterol: 245 mg/dL — ABNORMAL HIGH (ref 0–200)
HDL: 68.4 mg/dL (ref >39.00)
LDL Cholesterol: 152 mg/dL — ABNORMAL HIGH (ref 0–99)
NonHDL: 176.28
Total CHOL/HDL Ratio: 4
Triglycerides: 123 mg/dL (ref 0.0–149.0)
VLDL: 24.6 mg/dL (ref 0.0–40.0)

## 2023-01-17 LAB — URINALYSIS, ROUTINE W REFLEX MICROSCOPIC
Bilirubin Urine: NEGATIVE
Ketones, ur: NEGATIVE
Leukocytes,Ua: NEGATIVE
Nitrite: NEGATIVE
Specific Gravity, Urine: <=1.005 — AB (ref 1.000–1.030)
Total Protein, Urine: NEGATIVE
Urine Glucose: NEGATIVE
Urobilinogen, UA: 0.2 (ref 0.0–1.0)
pH: 6 (ref 5.0–8.0)

## 2023-01-17 LAB — HEMOGLOBIN A1C: Hgb A1c MFr Bld: 5.4 % (ref 4.6–6.5)

## 2023-01-17 LAB — MICROALBUMIN / CREATININE URINE RATIO
Creatinine,U: 20.1 mg/dL
Microalb Creat Ratio: 3.5 mg/g (ref 0.0–30.0)
Microalb, Ur: <0.7 mg/dL (ref 0.0–1.9)

## 2023-01-17 LAB — TSH: TSH: 1.25 u[IU]/mL (ref 0.35–5.50)

## 2023-01-17 LAB — CBC WITH DIFFERENTIAL/PLATELET
Basophils Absolute: 0 10*3/uL (ref 0.0–0.1)
Basophils Relative: 0.9 % (ref 0.0–3.0)
Eosinophils Absolute: 0.1 10*3/uL (ref 0.0–0.7)
Eosinophils Relative: 2.1 % (ref 0.0–5.0)
HCT: 40.5 % (ref 36.0–46.0)
Hemoglobin: 13.4 g/dL (ref 12.0–15.0)
Lymphocytes Relative: 18.4 % (ref 12.0–46.0)
Lymphs Abs: 0.9 10*3/uL (ref 0.7–4.0)
MCHC: 33 g/dL (ref 30.0–36.0)
MCV: 87.4 fL (ref 78.0–100.0)
Monocytes Absolute: 0.3 10*3/uL (ref 0.1–1.0)
Monocytes Relative: 5.5 % (ref 3.0–12.0)
Neutro Abs: 3.7 10*3/uL (ref 1.4–7.7)
Neutrophils Relative %: 73.1 % (ref 43.0–77.0)
Platelets: 334 10*3/uL (ref 150.0–400.0)
RBC: 4.64 Mil/uL (ref 3.87–5.11)
RDW: 12.9 % (ref 11.5–15.5)
WBC: 5.1 10*3/uL (ref 4.0–10.5)

## 2023-01-17 LAB — SEDIMENTATION RATE: Sed Rate: 28 mm/h (ref 0–30)

## 2023-01-17 IMAGING — US US PELVIS COMPLETE WITH TRANSVAGINAL
1 series · 13 of 25 positions shown · non-contrast
Comparison: None Available.

CLINICAL DATA: Post menopausal bleeding

EXAM:
TRANSABDOMINAL AND TRANSVAGINAL ULTRASOUND OF PELVIS
TECHNIQUE: Both transabdominal and transvaginal ultrasound examinations of the
pelvis were performed. Transabdominal technique was performed for
global imaging of the pelvis including uterus, ovaries, adnexal
regions, and pelvic cul-de-sac. It was necessary to proceed with
endovaginal exam following the transabdominal exam to visualize the
uterus endometrium ovaries.

[Series 1: us pelvis complete with transvaginal · 0.19mm/px · 13 of 73 slices shown]
[im 1/73]
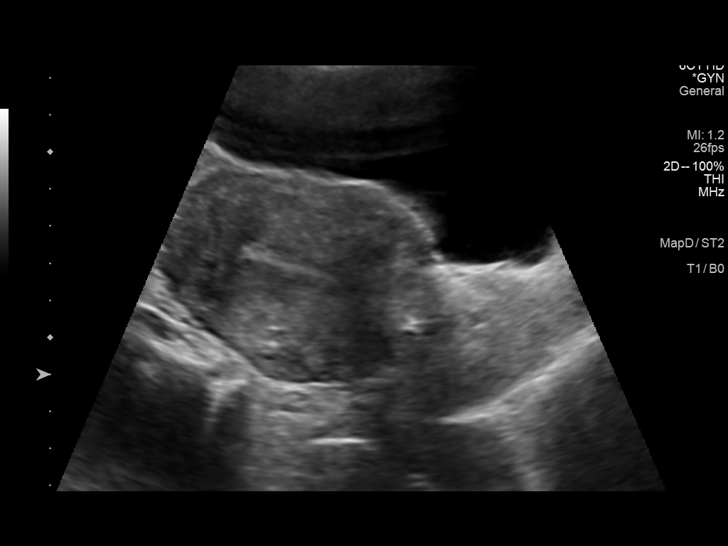
[im 7/73]
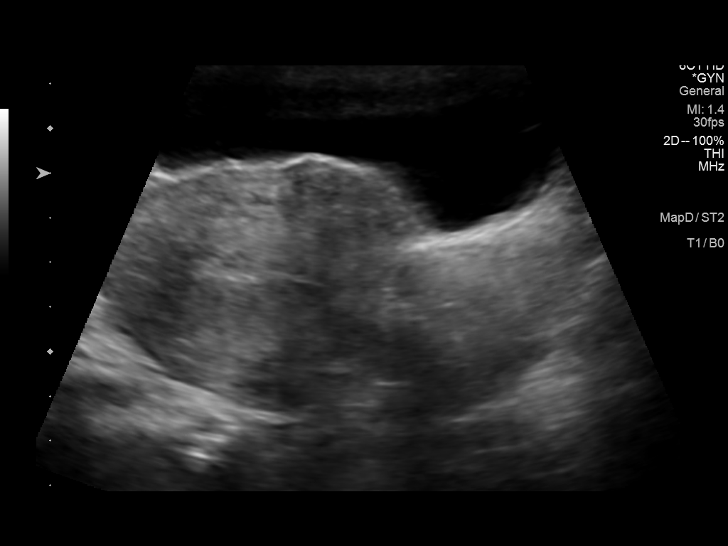
[im 13/73]
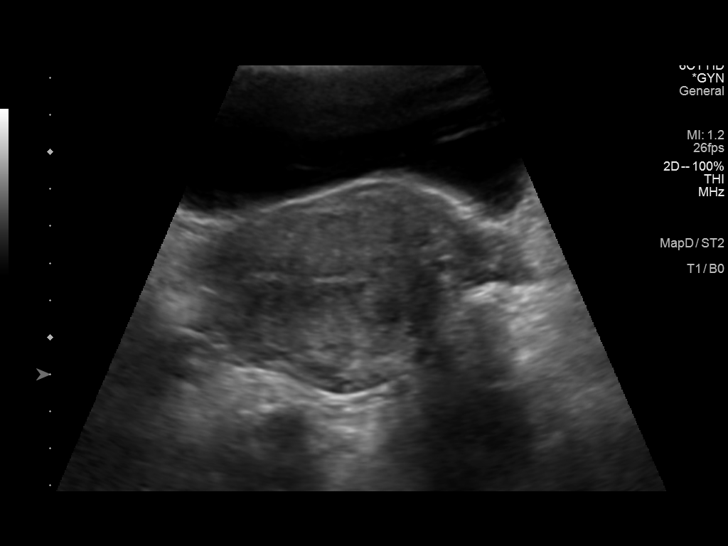
[im 19/73]
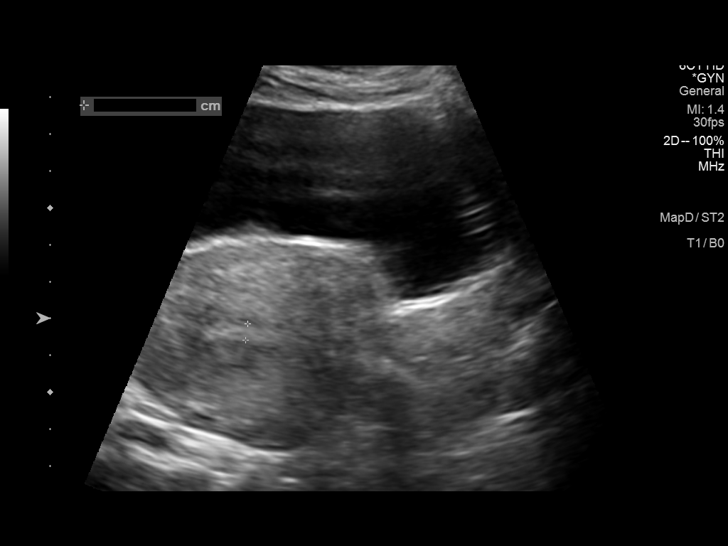
[im 25/73]
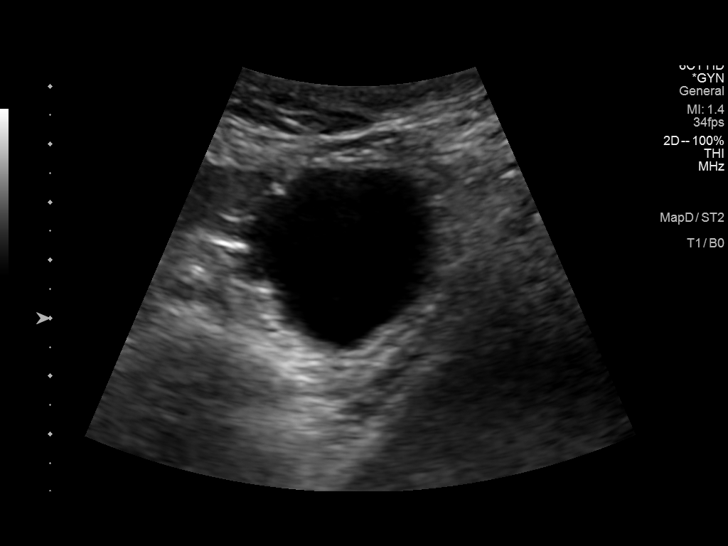
[im 31/73]
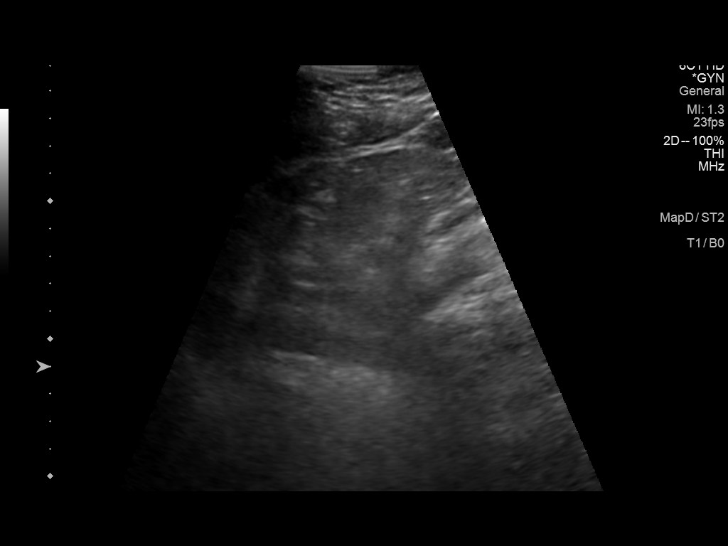
[im 37/73]
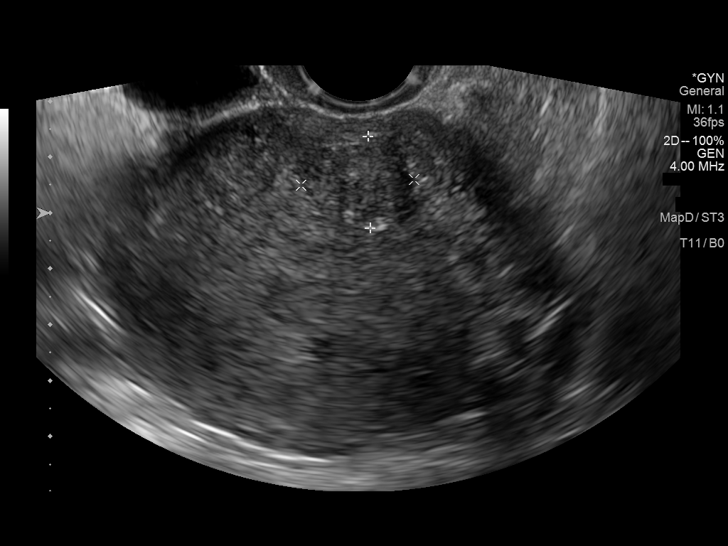
[im 43/73]
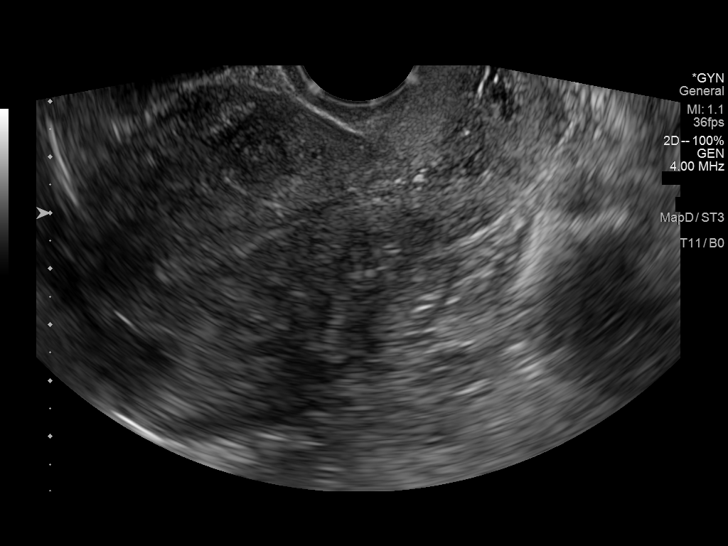
[im 49/73]
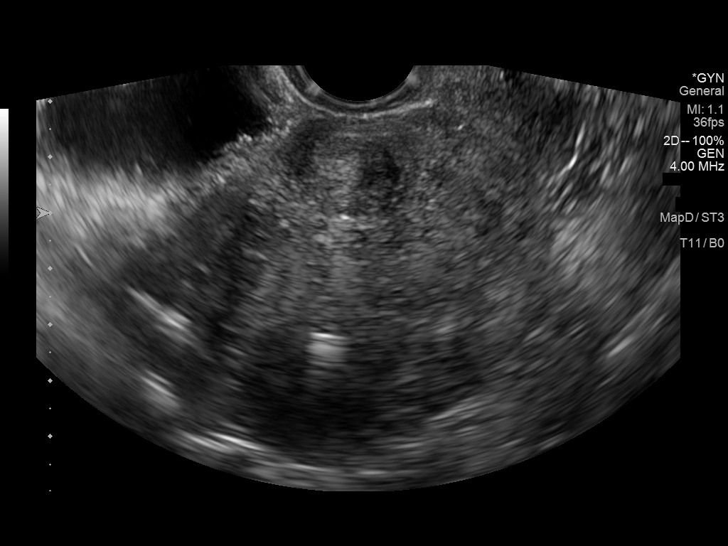
[im 55/73]
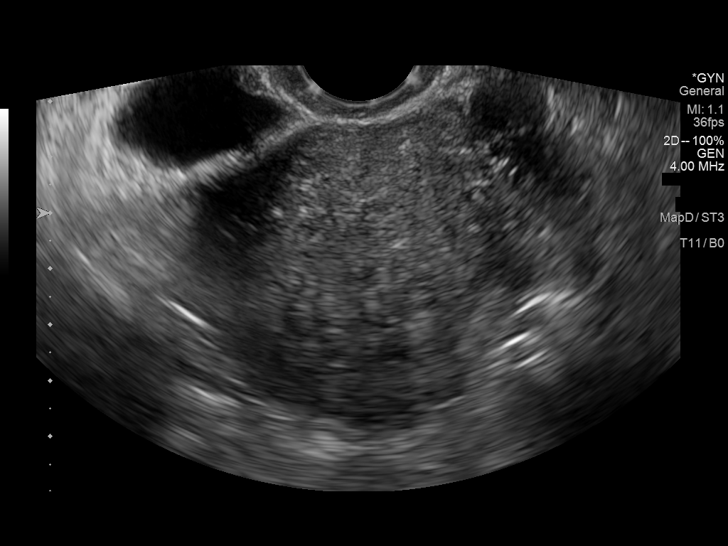
[im 61/73]
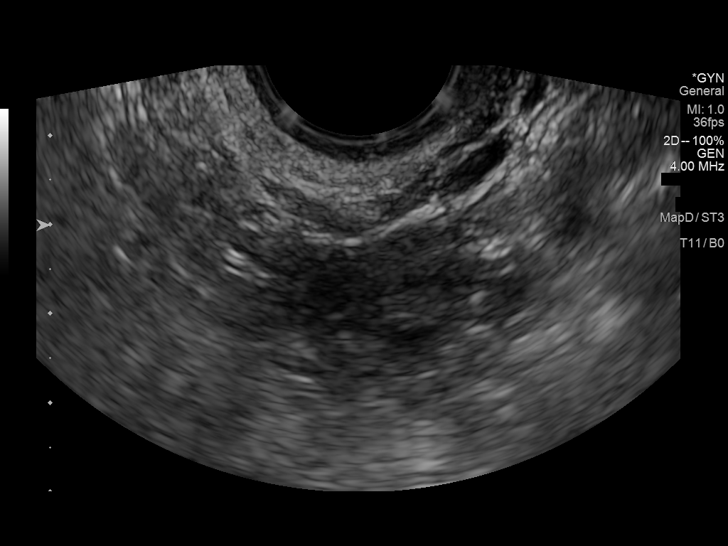
[im 67/73]
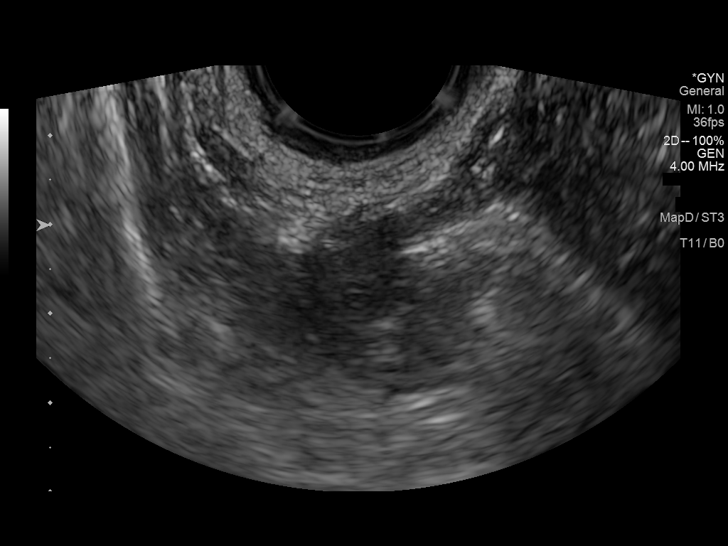
[im 73/73]
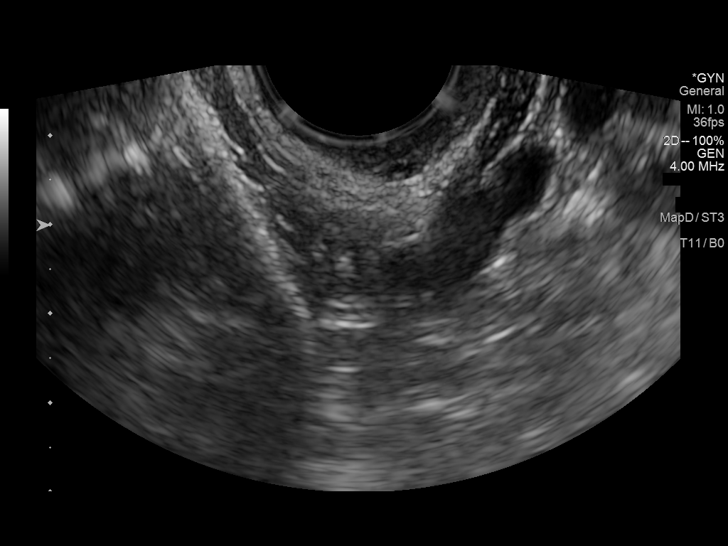

[13 of 25 positions shown; findings below may reference images not displayed]

FINDINGS: Uterus

Measurements: 9.2 x 5.8 x 6.1 cm = volume: 172 mL. Uterine fibroids,
the largest most discrete masses are measured. Anterior uterine
corpus intramural fibroid measures 16 by 20 x 25 mm. Subserosal
posterior hypoechoic fibroid measures 15 x 16 x 20 mm.

Endometrium

Thickness: 6.3 mm.  No focal abnormality visualized.

Right ovary

Measurements: 2.9 x 2.3 x 1.7 cm = volume: 5.8 mL. Normal
appearance/no adnexal mass.

Left ovary

Left ovarian tissue not discretely visualized. On trans abdominal
images, there is a left adnexal hypoechoic cyst measuring 34 x 35 x
31 mm which may contain a few internal septa.

Other findings

No abnormal free fluid.
IMPRESSION: 1. Endometrial thickness of 6.3 mm. In the setting of
post-menopausal bleeding, endometrial sampling is indicated to
exclude carcinoma. If results are benign, sonohysterogram should be
considered for focal lesion work-up. (Ref: Radiological Reasoning:
Algorithmic Workup of Abnormal Vaginal Bleeding with Endovaginal
Sonography and Sonohysterography. AJR [id number]; 191:[id number])
2. Uterine fibroids
3. 3.5 cm complex left adnexal cyst. Surgical consultation is
recommended.

## 2023-01-17 NOTE — Patient Instructions (Addendum)
Keep a diet high in fruits and vegetables, lean proteins (like chicken and fish), and healthy fats (such as olive oil or avocados) to improve overall health. Begin a regular exercise routine that includes both cardiovascular exercises and weightlifting to manage weight and improve stiffness. Undergo routine lab work to monitor hormonal levels and other health indicators. Schedule basic health screenings such as a colonoscopy due to family history of cancer and high-risk HPV status. Ensure vaccinations are up-to-date, including flu and COVID-19 vaccines

## 2023-01-17 NOTE — Progress Notes (Signed)
Assessment  Assessment/Plan:   Problem List Items Addressed This Visit       Other   Anxiety   MDD (major depressive disorder)   Vitamin D deficiency   Relevant Orders   Vitamin D 1,25 dihydroxy   Class 1 obesity due to excess calories without serious comorbidity with body mass index (BMI) of 31.0 to 31.9 in adult    Encourage dietary modifications and regular exercise.  Restratification labs as ordered.  At a follow-up consider GLP-1 if weight loss insufficient.      Relevant Orders   TSH (Completed)   Lipid panel (Completed)   Hemoglobin A1c (Completed)   Microalbumin / creatinine urine ratio (Completed)   Urinalysis, Routine w reflex microscopic (Completed)   CBC with Differential/Platelet (Completed)   Comprehensive metabolic panel (Completed)   Multiple stiff joints   Relevant Orders   Vitamin D 1,25 dihydroxy   Sedimentation rate (Completed)   CRP High sensitivity (Completed)   Status post hysterectomy    Follow-up with OB/GYN to monitor hormone levels and other sequelae from implant      Screening for colon cancer    Patient has upcoming appointment with [facility] for colonoscopy      Other Visit Diagnoses     Encounter for well adult exam without abnormal findings    -  Primary   Screening for viral disease       Relevant Orders   HCV Ab w Reflex to Quant PCR (Completed)   HIV Antibody (routine testing w rflx) (Completed)       There are no discontinued medications.  Patient Counseling(The following topics were reviewed and/or handout was given):  -Nutrition: Stressed importance of moderation in sodium/caffeine intake, saturated fat and cholesterol, caloric balance, sufficient intake of fresh fruits, vegetables, and fiber.  -Stressed the importance of regular exercise.   -Substance Abuse: Discussed cessation/primary prevention of tobacco, alcohol, or other drug use; driving or other dangerous activities under the influence; availability of treatment  for abuse.   -Injury prevention: Discussed safety belts, safety helmets, smoke detector, smoking near bedding or upholstery.   -Sexuality: Discussed sexually transmitted diseases, partner selection, use of condoms, avoidance of unintended pregnancy and contraceptive alternatives.   -Dental health: Discussed importance of regular tooth brushing, flossing, and dental visits.  -Health maintenance and immunizations reviewed. Please refer to Health maintenance section.  Return to care in 1 year for next preventative visit.        Subjective:  Chief complaint Encounter date: 01/17/2023  Chief Complaint  Patient presents with   Annual Exam    Fasting. Has an appt with [facility] for colonoscopy options.    Sierra Cantrell is a 52 y.o. female who presents today for her annual comprehensive physical exam.    History of Present Illness: Sierra Cantrell returns for her annual physical, feeling significant stress due to family issues, including her father's passing and caring for her mother in [place]. She notes weight gain, decreased physical health, and lack of regular exercise. Sierra Cantrell is seven weeks post-hysterectomy for removal of Essure coils and awaits hormone testing due to potential nickel content symptoms. She discontinued Wellbutrin due to increased anxiety and is not using hydroxyzine. She seeks weight management options and ask about semaglutide usage.   Colon cancer screening.  Patient has upcoming appointment with [facility] for colonoscopy Lifestyle Diet: Attempting healthy, but difficult to maintain due to ongoing difficult life circumstances and stress Exercise: Lacks regular physical activity,  Review of Systems  Constitutional:  Positive for malaise/fatigue. Negative for chills, diaphoresis, fever and weight loss.  HENT:  Negative for congestion, ear discharge, ear pain and hearing loss.   Eyes:  Negative for blurred vision, double vision, photophobia, pain, discharge and  redness.  Respiratory:  Negative for cough, sputum production, shortness of breath and wheezing.   Cardiovascular:  Negative for chest pain and palpitations.  Gastrointestinal:  Negative for abdominal pain, blood in stool, constipation, diarrhea, heartburn, melena, nausea and vomiting.  Genitourinary:  Negative for dysuria, flank pain, frequency, hematuria and urgency.  Musculoskeletal:  Positive for joint pain. Negative for myalgias.  Skin:  Negative for itching and rash.  Neurological:  Negative for dizziness, tingling, tremors, speech change, seizures, loss of consciousness, weakness and headaches.  Endo/Heme/Allergies:  Negative for polydipsia.  Psychiatric/Behavioral:  Negative for depression, hallucinations, memory loss, substance abuse and suicidal ideas. The patient is nervous/anxious. The patient does not have insomnia.   All other systems reviewed and are negative.      01/17/2023    9:40 AM 10/12/2021    2:42 PM  Depression screen PHQ 2/9  Decreased Interest 0 3  Down, Depressed, Hopeless 0 3  PHQ - 2 Score 0 6  Altered sleeping 1 0  Tired, decreased energy 0 0  Change in appetite 1 1  Feeling bad or failure about yourself  0 1  Trouble concentrating 0 3  Moving slowly or fidgety/restless 0 0  Suicidal thoughts 0 0  PHQ-9 Score 2 11  Difficult doing work/chores Not difficult at all Very difficult       01/17/2023    9:40 AM 10/12/2021    2:42 PM  GAD 7 : Generalized Anxiety Score  Nervous, Anxious, on Edge 1 3  Control/stop worrying 1 3  Worry too much - different things 1 3  Trouble relaxing 1 1  Restless 0 0  Easily annoyed or irritable 0 0  Afraid - awful might happen 1 3  Total GAD 7 Score 5 13  Anxiety Difficulty Somewhat difficult Extremely difficult    Health Maintenance Due  Topic Date Due   DTaP/Tdap/Td (1 - Tdap) Never done   Colonoscopy  Never done   Zoster Vaccines- Shingrix (1 of 2) Never done      PMH:  The following were reviewed and  entered/updated in epic: Past Medical History:  Diagnosis Date   Abnormal uterine bleeding (AUB) 10/12/2021   Anxiety    Depression    Fibroid    History of abnormal cervical Pap smear    Hx LEEP procedure 2019   Screening for colon cancer 01/19/2023   STD (sexually transmitted disease)    HR HPV    Patient Active Problem List   Diagnosis Date Noted   Class 1 obesity due to excess calories without serious comorbidity with body mass index (BMI) of 31.0 to 31.9 in adult 01/19/2023   Multiple stiff joints 01/19/2023   Status post hysterectomy 01/19/2023   Screening for colon cancer 01/19/2023   Vitamin D deficiency 01/17/2023   Anxiety 10/12/2021   MDD (major depressive disorder) 10/12/2021   Ovarian cyst 09/26/2021    Past Surgical History:  Procedure Laterality Date   ABDOMINAL SURGERY     "tummy tuck"   AUGMENTATION MAMMAPLASTY     silicone breast implants   BREAST BIOPSY Right    CERVICAL BIOPSY  W/ LOOP ELECTRODE EXCISION  05/31/2017   CESAREAN SECTION     COSMETIC SURGERY  10/2009 10/2013   ESSURE  TUBAL LIGATION     robotic total laparoscopic hysterectomy with bilateral salpingectomy  11/28/2022    Family History  Problem Relation Age of Onset   Hypertension Mother    Diabetes Mother    Stroke Father    Hyperlipidemia Father    Coronary artery disease Father    Diabetes Brother    Breast cancer Paternal Grandmother 60    Medications- reviewed and updated Outpatient Medications Prior to Visit  Medication Sig Dispense Refill   medroxyPROGESTERone (PROVERA) 10 MG tablet Take 1 tablet (10 mg total) by mouth daily. Take one by mouth daily for 10 days. (Patient not taking: Reported on 01/17/2023) 10 tablet 0   No facility-administered medications prior to visit.    Allergies  Allergen Reactions   Wellbutrin [Bupropion] Other (See Comments)    Increased her anxiety    Social History   Socioeconomic History   Marital status: Married    Spouse name: Not on  file   Number of children: Not on file   Years of education: Not on file   Highest education level: Not on file  Occupational History   Not on file  Tobacco Use   Smoking status: Former    Current packs/day: 0.00    Average packs/day: 0.5 packs/day for 10.0 years (5.0 ttl pk-yrs)    Types: Cigarettes    Start date: 09/26/1999    Quit date: 09/25/2009    Years since quitting: 13.3    Passive exposure: Never   Smokeless tobacco: Never  Vaping Use   Vaping status: Never Used  Substance and Sexual Activity   Alcohol use: Never   Drug use: Never   Sexual activity: Yes    Birth control/protection: Surgical    Comment: Essure/partner vas, first intercourse <16  Other Topics Concern   Not on file  Social History Narrative   Not on file   Social Determinants of Health   Financial Resource Strain: Not on file  Food Insecurity: Not on file  Transportation Needs: Not on file  Physical Activity: Not on file  Stress: Not on file  Social Connections: Not on file           Objective:  Physical Exam: BP 118/74 (BP Location: Left Arm, Patient Position: Sitting, Cuff Size: Large)   Pulse 67   Temp 98.7 F (37.1 C) (Temporal)   Ht 4\' 11"  (1.499 m)   Wt 156 lb 6.4 oz (70.9 kg)   LMP  (LMP Unknown)   SpO2 98%   BMI 31.59 kg/m   Body mass index is 31.59 kg/m. Wt Readings from Last 3 Encounters:  01/17/23 156 lb 6.4 oz (70.9 kg)  09/07/22 150 lb (68 kg)  06/15/22 150 lb (68 kg)    Physical Exam Constitutional:      General: She is not in acute distress.    Appearance: Normal appearance. She is not ill-appearing or toxic-appearing.  HENT:     Head: Normocephalic and atraumatic.     Right Ear: Hearing, tympanic membrane, ear canal and external ear normal. There is no impacted cerumen.     Left Ear: Hearing, tympanic membrane, ear canal and external ear normal. There is no impacted cerumen.     Nose: Nose normal. No congestion.     Mouth/Throat:     Lips: No lesions.      Mouth: Mucous membranes are moist.     Pharynx: Oropharynx is clear. No oropharyngeal exudate.  Eyes:     General: No scleral icterus.  Right eye: No discharge.        Left eye: No discharge.     Conjunctiva/sclera: Conjunctivae normal.     Pupils: Pupils are equal, round, and reactive to light.  Neck:     Thyroid: No thyroid mass, thyromegaly or thyroid tenderness.  Cardiovascular:     Rate and Rhythm: Normal rate and regular rhythm.     Pulses: Normal pulses.     Heart sounds: Normal heart sounds.  Pulmonary:     Effort: Pulmonary effort is normal. No respiratory distress.     Breath sounds: Normal breath sounds.  Abdominal:     General: Abdomen is flat. Bowel sounds are normal.     Palpations: Abdomen is soft.  Musculoskeletal:        General: Normal range of motion.     Cervical back: Normal range of motion.     Right lower leg: No edema.     Left lower leg: No edema.  Lymphadenopathy:     Cervical: No cervical adenopathy.  Skin:    General: Skin is warm and dry.     Findings: No rash.  Neurological:     General: No focal deficit present.     Mental Status: She is alert and oriented to person, place, and time. Mental status is at baseline.     Deep Tendon Reflexes:     Reflex Scores:      Patellar reflexes are 2+ on the right side and 2+ on the left side. Psychiatric:        Behavior: Behavior normal.        Thought Content: Thought content normal.        Judgment: Judgment normal.         At today's visit, we discussed treatment options, associated risk and benefits, and engage in counseling as needed.  Additionally the following were reviewed: Past medical records, past medical and surgical history, family and social background, as well as relevant laboratory results, imaging findings, and specialty notes, where applicable.  This message was generated using dictation software, and as a result, it may contain unintentional typos or errors.  Nevertheless,  extensive effort was made to accurately convey at the pertinent aspects of the patient visit.    There may have been are other unrelated non-urgent complaints, but due to the busy schedule and the amount of time already spent with her, time does not permit to address these issues at today's visit. Another appointment may have or has been requested to review these additional issues.     Thomes Dinning, MD, MS

## 2023-01-18 ENCOUNTER — Encounter: Payer: Self-pay | Admitting: Obstetrics and Gynecology

## 2023-01-18 LAB — HCV AB W REFLEX TO QUANT PCR: HCV Ab: NONREACTIVE

## 2023-01-18 LAB — HCV INTERPRETATION

## 2023-01-19 ENCOUNTER — Encounter: Payer: Self-pay | Admitting: Family Medicine

## 2023-01-19 DIAGNOSIS — M2560 Stiffness of unspecified joint, not elsewhere classified: Secondary | ICD-10-CM | POA: Insufficient documentation

## 2023-01-19 DIAGNOSIS — E6609 Other obesity due to excess calories: Secondary | ICD-10-CM | POA: Insufficient documentation

## 2023-01-19 DIAGNOSIS — Z90710 Acquired absence of both cervix and uterus: Secondary | ICD-10-CM | POA: Insufficient documentation

## 2023-01-19 DIAGNOSIS — Z1211 Encounter for screening for malignant neoplasm of colon: Secondary | ICD-10-CM | POA: Insufficient documentation

## 2023-01-19 HISTORY — DX: Encounter for screening for malignant neoplasm of colon: Z12.11

## 2023-01-19 NOTE — Assessment & Plan Note (Signed)
Follow-up with OB/GYN to monitor hormone levels and other sequelae from implant

## 2023-01-19 NOTE — Assessment & Plan Note (Signed)
Patient has upcoming appointment with [facility] for colonoscopy

## 2023-01-19 NOTE — Assessment & Plan Note (Addendum)
Encourage dietary modifications and regular exercise.  Restratification labs as ordered.  At a follow-up consider GLP-1 if weight loss insufficient.

## 2023-01-20 LAB — VITAMIN D 1,25 DIHYDROXY
Vitamin D 1, 25 (OH)2 Total: 33 pg/mL (ref 18–72)
Vitamin D2 1, 25 (OH)2: <8 pg/mL
Vitamin D3 1, 25 (OH)2: 33 pg/mL

## 2023-01-20 LAB — HIV ANTIBODY (ROUTINE TESTING W REFLEX): HIV 1&2 Ab, 4th Generation: NONREACTIVE

## 2023-01-23 ENCOUNTER — Other Ambulatory Visit: Payer: Self-pay | Admitting: Family Medicine

## 2023-01-23 DIAGNOSIS — Z1231 Encounter for screening mammogram for malignant neoplasm of breast: Secondary | ICD-10-CM

## 2023-02-14 ENCOUNTER — Ambulatory Visit: Payer: 59 | Admitting: Family Medicine

## 2023-02-14 ENCOUNTER — Telehealth: Payer: Self-pay | Admitting: Family Medicine

## 2023-02-14 NOTE — Telephone Encounter (Signed)
NS no reason given test sent.

## 2023-02-15 NOTE — Telephone Encounter (Signed)
1st no show, text sent, letter sent via mychart

## 2023-02-27 ENCOUNTER — Encounter: Payer: Self-pay | Admitting: Family Medicine

## 2023-03-02 ENCOUNTER — Ambulatory Visit
Admission: RE | Admit: 2023-03-02 | Discharge: 2023-03-02 | Disposition: A | Discharge status: Home / Self Care | Payer: 59 | Source: Ambulatory Visit | Attending: Family Medicine | Admitting: Family Medicine

## 2023-03-02 DIAGNOSIS — Z1231 Encounter for screening mammogram for malignant neoplasm of breast: Secondary | ICD-10-CM

## 2023-03-05 ENCOUNTER — Telehealth: Payer: Self-pay

## 2023-03-05 NOTE — Telephone Encounter (Signed)
Form completed and faxed to [facility] successfully. Charge sheet and form placed in Proofreader.

## 2023-03-06 ENCOUNTER — Encounter: Payer: Self-pay | Admitting: Family Medicine

## 2023-03-06 ENCOUNTER — Ambulatory Visit: Payer: 59 | Admitting: Family Medicine

## 2023-03-06 VITALS — BP 126/82 | HR 86 | Temp 97.4°F | Wt 148.8 lb

## 2023-03-06 DIAGNOSIS — T8543XA Leakage of breast prosthesis and implant, initial encounter: Secondary | ICD-10-CM

## 2023-03-06 DIAGNOSIS — F331 Major depressive disorder, recurrent, moderate: Secondary | ICD-10-CM | POA: Diagnosis not present

## 2023-03-06 DIAGNOSIS — F419 Anxiety disorder, unspecified: Secondary | ICD-10-CM | POA: Diagnosis not present

## 2023-03-06 DIAGNOSIS — F4521 Hypochondriasis: Secondary | ICD-10-CM

## 2023-03-06 MED ORDER — VILAZODONE HCL 10 MG PO TABS: ORAL_TABLET | ORAL | 0 refills | Status: DC

## 2023-03-06 NOTE — Progress Notes (Unsigned)
Assessment/Plan:   Problem List Items Addressed This Visit       Other   MDD (major depressive disorder)   Relevant Medications   Vilazodone HCl (VIIBRYD) 10 MG TABS   Other Relevant Orders   Ambulatory referral to Behavioral Health   Other Visit Diagnoses     Illness anxiety disorder    -  Primary   Relevant Medications   Vilazodone HCl (VIIBRYD) 10 MG TABS   Other Relevant Orders   Ambulatory referral to Behavioral Health   Breast implant rupture, initial encounter       Relevant Orders   Ambulatory Referral for Breast Surgery       Medications Discontinued During This Encounter  Medication Reason   medroxyPROGESTERone (PROVERA) 10 MG tablet     Return in about 4 weeks (around 04/03/2023).    Subjective:   Encounter date: 03/06/2023  Sierra Cantrell is a 52 y.o. female who has Anxiety; MDD (major depressive disorder); Ovarian cyst; Vitamin D deficiency; Class 1 obesity due to excess calories without serious comorbidity with body mass index (BMI) of 31.0 to 31.9 in adult; Multiple stiff joints; Status post hysterectomy; and Screening for colon cancer on their problem list..   She  has a past medical history of Abnormal uterine bleeding (AUB) (10/12/2021), Anxiety, Depression, Fibroid, History of abnormal cervical Pap smear, Screening for colon cancer (01/19/2023), and STD (sexually transmitted disease)..   She presents with chief complaint of Anxiety (Wellbutrin didn't agree with patient. Would like to discuss alternatives and therapy. Plastic surgeon referral for mammogram results. ) .   HPI:   ROS  Past Surgical History:  Procedure Laterality Date   ABDOMINAL SURGERY     "tummy tuck"   AUGMENTATION MAMMAPLASTY     silicone breast implants   BREAST BIOPSY Right    CERVICAL BIOPSY  W/ LOOP ELECTRODE EXCISION  05/31/2017   CESAREAN SECTION     COSMETIC SURGERY  10/2009 10/2013   ESSURE TUBAL LIGATION     robotic total laparoscopic hysterectomy with  bilateral salpingectomy  11/28/2022    Outpatient Medications Prior to Visit  Medication Sig Dispense Refill   progesterone (PROMETRIUM) 100 MG capsule Take 100 mg by mouth at bedtime.     medroxyPROGESTERone (PROVERA) 10 MG tablet Take 1 tablet (10 mg total) by mouth daily. Take one by mouth daily for 10 days. (Patient not taking: Reported on 01/17/2023) 10 tablet 0   No facility-administered medications prior to visit.    Family History  Problem Relation Age of Onset   Hypertension Mother    Diabetes Mother    Stroke Father    Hyperlipidemia Father    Coronary artery disease Father    Diabetes Brother    Breast cancer Paternal Grandmother 34    Social History   Socioeconomic History   Marital status: Married    Spouse name: Not on file   Number of children: Not on file   Years of education: Not on file   Highest education level: Not on file  Occupational History   Not on file  Tobacco Use   Smoking status: Former    Current packs/day: 0.00    Average packs/day: 0.5 packs/day for 10.0 years (5.0 ttl pk-yrs)    Types: Cigarettes    Start date: 09/26/1999    Quit date: 09/25/2009    Years since quitting: 13.4    Passive exposure: Never   Smokeless tobacco: Never  Vaping Use   Vaping status: Never Used  Substance and Sexual Activity   Alcohol use: Never   Drug use: Never   Sexual activity: Yes    Birth control/protection: Surgical    Comment: Essure/partner vas, first intercourse <16  Other Topics Concern   Not on file  Social History Narrative   Not on file   Social Determinants of Health   Financial Resource Strain: Not on file  Food Insecurity: Not on file  Transportation Needs: Not on file  Physical Activity: Not on file  Stress: Not on file  Social Connections: Not on file  Intimate Partner Violence: Not on file                                                                                                  Objective:  Physical Exam: BP 126/82  (BP Location: Left Arm, Patient Position: Sitting, Cuff Size: Large)   Pulse 86   Temp (!) 97.4 F (36.3 C) (Temporal)   Wt 148 lb 12.8 oz (67.5 kg)   LMP  (LMP Unknown)   SpO2 98%   BMI 30.05 kg/m     Physical Exam  MM 3D SCREEN BREAST W/IMPLANT BILATERAL  Result Date: 03/05/2023 CLINICAL DATA:  Screening. EXAM: DIGITAL SCREENING BILATERAL MAMMOGRAM WITH IMPLANTS, CAD AND TOMOSYNTHESIS TECHNIQUE: Bilateral screening digital craniocaudal and mediolateral oblique mammograms were obtained. Bilateral screening digital breast tomosynthesis was performed. The images were evaluated with computer-aided detection. Standard and/or implant displaced views were performed. COMPARISON:  Previous exam(s). ACR Breast Density Category c: The breasts are heterogeneously dense, which may obscure small masses. FINDINGS: The patient has retropectoral implants. There are no findings suspicious for malignancy. IMPRESSION: 1. No mammographic evidence of malignancy. A result letter of this screening mammogram will be mailed directly to the patient. 2. Contour abnormality of the right breast implant suggesting possible implant rupture. RECOMMENDATION: 1.  Screening mammogram in one year. (Code:SM-B-01Y) 2. Consider bilateral breast MRI without contrast to further evaluate implant integrity. BI-RADS CATEGORY  2: Benign. Electronically Signed   By: Frederico Hamman M.D.   On: 03/05/2023 15:37    Recent Results (from the past 2160 hour(s))  TSH     Status: None   Collection Time: 01/17/23 10:14 AM  Result Value Ref Range   TSH 1.25 0.35 - 5.50 uIU/mL  Lipid panel     Status: Abnormal   Collection Time: 01/17/23 10:14 AM  Result Value Ref Range   Cholesterol 245 (H) 0 - 200 mg/dL    Comment: ATP III Classification       Desirable:  < 200 mg/dL               Borderline High:  200 - 239 mg/dL          High:  > = 244 mg/dL   Triglycerides 010.2 0.0 - 149.0 mg/dL    Comment: Normal:  <725 mg/dLBorderline High:  150 -  199 mg/dL   HDL 36.64 >40.34 mg/dL   VLDL 74.2 0.0 - 59.5 mg/dL   LDL Cholesterol 638 (H) 0 - 99 mg/dL   Total CHOL/HDL Ratio 4  Comment:                Men          Women1/2 Average Risk     3.4          3.3Average Risk          5.0          4.42X Average Risk          9.6          7.13X Average Risk          15.0          11.0                       NonHDL 176.28     Comment: NOTE:  Non-HDL goal should be 30 mg/dL higher than patient's LDL goal (i.e. LDL goal of < 70 mg/dL, would have non-HDL goal of < 100 mg/dL)  Hemoglobin Z6X     Status: None   Collection Time: 01/17/23 10:14 AM  Result Value Ref Range   Hgb A1c MFr Bld 5.4 4.6 - 6.5 %    Comment: Glycemic Control Guidelines for People with Diabetes:Non Diabetic:  <6%Goal of Therapy: <7%Additional Action Suggested:  >8%   Microalbumin / creatinine urine ratio     Status: None   Collection Time: 01/17/23 10:14 AM  Result Value Ref Range   Microalb, Ur <0.7 0.0 - 1.9 mg/dL   Creatinine,U 09.6 mg/dL   Microalb Creat Ratio 3.5 0.0 - 30.0 mg/g  Urinalysis, Routine w reflex microscopic     Status: Abnormal   Collection Time: 01/17/23 10:14 AM  Result Value Ref Range   Color, Urine YELLOW Yellow;Lt. Yellow;Straw;Dark Yellow;Amber;Green;Red;Brown   APPearance CLEAR Clear;Turbid;Slightly Cloudy;Cloudy   Specific Gravity, Urine <=1.005 (A) 1.000 - 1.030   pH 6.0 5.0 - 8.0   Total Protein, Urine NEGATIVE Negative   Urine Glucose NEGATIVE Negative   Ketones, ur NEGATIVE Negative   Bilirubin Urine NEGATIVE Negative   Hgb urine dipstick SMALL (A) Negative   Urobilinogen, UA 0.2 0.0 - 1.0   Leukocytes,Ua NEGATIVE Negative   Nitrite NEGATIVE Negative   WBC, UA 0-2/hpf 0-2/hpf   RBC / HPF 0-2/hpf 0-2/hpf  Vitamin D 1,25 dihydroxy     Status: None   Collection Time: 01/17/23 10:14 AM  Result Value Ref Range   Vitamin D 1, 25 (OH)2 Total 33 18 - 72 pg/mL   Vitamin D3 1, 25 (OH)2 33 pg/mL   Vitamin D2 1, 25 (OH)2 <8 pg/mL    Comment:  (Note) Vitamin D3, 1,25(OH)2 indicates both endogenous  production and supplementation. Vitamin D2, 1,25(OH)2 is  an indicator of exogenous sources, such as diet or  supplementation. Interpretation and therapy are based on  measurement of Vitamin D, 1,25 (OH)2, Total. . This test was developed, and its analytical performance  characteristics have been determined by [employer]. It has not been cleared or approved by the  FDA. This assay has been validated pursuant to the CLIA  regulations and is used for clinical purposes. . For additional information, please refer to http://education.[facility].com/faq/FAQ199 (This link is being provided for  informational/educational purposes only.) . [facility] med fusion [address] [place] [address] [phone] Junita Push L. Kennet Mccort Caul, MD, PhD   CBC with Differential/Platelet     Status: None   Collection Time: 01/17/23 10:14 AM  Result Value Ref Range   WBC 5.1 4.0 -  10.5 K/uL   RBC 4.64 3.87 - 5.11 Mil/uL   Hemoglobin 13.4 12.0 - 15.0 g/dL   HCT 78.4 69.6 - 29.5 %   MCV 87.4 78.0 - 100.0 fl   MCHC 33.0 30.0 - 36.0 g/dL   RDW 28.4 13.2 - 44.0 %   Platelets 334.0 150.0 - 400.0 K/uL   Neutrophils Relative % 73.1 43.0 - 77.0 %   Lymphocytes Relative 18.4 12.0 - 46.0 %   Monocytes Relative 5.5 3.0 - 12.0 %   Eosinophils Relative 2.1 0.0 - 5.0 %   Basophils Relative 0.9 0.0 - 3.0 %   Neutro Abs 3.7 1.4 - 7.7 K/uL   Lymphs Abs 0.9 0.7 - 4.0 K/uL   Monocytes Absolute 0.3 0.1 - 1.0 K/uL   Eosinophils Absolute 0.1 0.0 - 0.7 K/uL   Basophils Absolute 0.0 0.0 - 0.1 K/uL  Comprehensive metabolic panel     Status: Abnormal   Collection Time: 01/17/23 10:14 AM  Result Value Ref Range   Sodium 140 135 - 145 mEq/L   Potassium 4.0 3.5 - 5.1 mEq/L   Chloride 103 96 - 112 mEq/L   CO2 27 19 - 32 mEq/L   Glucose, Bld 101 (H) 70 - 99 mg/dL   BUN 14 6 - 23 mg/dL   Creatinine, Ser 1.02 0.40 - 1.20 mg/dL   Total  Bilirubin 0.9 0.2 - 1.2 mg/dL   Alkaline Phosphatase 59 39 - 117 U/L   AST 16 0 - 37 U/L   ALT 18 0 - 35 U/L   Total Protein 7.3 6.0 - 8.3 g/dL   Albumin 4.6 3.5 - 5.2 g/dL   GFR 72.53 >66.44 mL/min    Comment: Calculated using the CKD-EPI Creatinine Equation (2021)   Calcium 9.9 8.4 - 10.5 mg/dL  Sedimentation rate     Status: None   Collection Time: 01/17/23 10:14 AM  Result Value Ref Range   Sed Rate 28 0 - 30 mm/hr  CRP High sensitivity     Status: None   Collection Time: 01/17/23 10:14 AM  Result Value Ref Range   CRP, High Sensitivity 1.840 0.000 - 5.000 mg/L    Comment: Note:  An elevated hs-CRP (>5 mg/L) should be repeated after 2 weeks to rule out recent infection or trauma.  HCV Ab w Reflex to Quant PCR     Status: None   Collection Time: 01/17/23 10:14 AM  Result Value Ref Range   HCV Ab Non Reactive Non Reactive  HIV Antibody (routine testing w rflx)     Status: None   Collection Time: 01/17/23 10:14 AM  Result Value Ref Range   HIV 1&2 Ab, 4th Generation NON-REACTIVE NON-REACTIVE    Comment: HIV-1 antigen and HIV-1/HIV-2 antibodies were not detected. There is no laboratory evidence of HIV infection. Marland Kitchen PLEASE NOTE: This information has been disclosed to you from records whose confidentiality may be protected by state law.  If your state requires such protection, then the state law prohibits you from making any further disclosure of the information without the specific written consent of the person to whom it pertains, or as otherwise permitted by law. A general authorization for the release of medical or other information is NOT sufficient for this purpose. . For additional information please refer to http://education.[facility].com/faq/FAQ106 (This link is being provided for informational/ educational purposes only.) . Marland Kitchen The performance of this assay has not been clinically validated in patients less than 62 years old. .   Interpretation:  Status: None   Collection Time: 01/17/23 10:14 AM  Result Value Ref Range   HCV Interp 1: Comment     Comment: Not infected with HCV unless early or acute infection is suspected (which may be delayed in an immunocompromised individual), or other evidence exists to indicate HCV infection.         Garner Nash, MD, MS

## 2023-03-08 DIAGNOSIS — T8543XA Leakage of breast prosthesis and implant, initial encounter: Secondary | ICD-10-CM | POA: Insufficient documentation

## 2023-03-08 NOTE — Assessment & Plan Note (Signed)
Observed on recent mammogram. Desires removal of implants.  Plan:  Refer to breast surgeon for evaluation and management of suspected implant rupture.

## 2023-03-08 NOTE — Assessment & Plan Note (Addendum)
Depression and Anxiety Symptoms are worsening, with significant impact on daily functioning and quality of life.  Plan: Initiate vilazodone 10?mg daily for 7 days, then increase to 20?mg daily. Educate patient on potential side effects and importance of adherence. Discussed that vilazodone has a lower risk of weight gain and no significant interaction with tirzepatide. Encourage engagement in psychotherapy, specifically cognitive-behavioral therapy, to address health anxiety.

## 2023-03-14 ENCOUNTER — Encounter: Payer: Self-pay | Admitting: Family Medicine

## 2023-03-22 LAB — HM COLONOSCOPY

## 2023-04-09 ENCOUNTER — Ambulatory Visit: Payer: 59 | Admitting: Family Medicine

## 2023-04-16 ENCOUNTER — Encounter: Payer: Self-pay | Admitting: Family Medicine

## 2023-04-19 ENCOUNTER — Other Ambulatory Visit: Payer: Self-pay | Admitting: Family Medicine

## 2023-04-19 DIAGNOSIS — F4521 Hypochondriasis: Secondary | ICD-10-CM

## 2023-04-19 MED ORDER — VILAZODONE HCL 10 MG PO TABS: ORAL_TABLET | ORAL | 0 refills | Status: DC

## 2023-04-19 NOTE — Telephone Encounter (Signed)
 Rx sent to pharmacy

## 2023-04-19 NOTE — Telephone Encounter (Signed)
 Copied from CRM [id number]. Topic: Clinical - Medication Refill >> Apr 19, 2023  2:41 PM Eleanor C wrote: Most Recent Primary Care Visit:  Provider: SEBASTIAN BEVERLEY NOVAK  Department: [facility]  Visit Type: OFFICE VISIT  Date: 03/06/2023  Medication: Vilazodone  HCl (VIIBRYD ) 10 MG TABS  Has the patient contacted their pharmacy? Yes (Agent: If no, request that the patient contact the pharmacy for the refill. If patient does not wish to contact the pharmacy document the reason why and proceed with request.) (Agent: If yes, when and what did the pharmacy advise?)  Is this the correct pharmacy for this prescription? Yes If no, delete pharmacy and type the correct one.  This is the patient's preferred pharmacy:  [id number] [address], [place] - [address] [place] [address] Phone: [phone] Fax: [phone]   Has the prescription been filled recently? No  Is the patient out of the medication? No  Has the patient been seen for an appointment in the last year OR does the patient have an upcoming appointment? Yes- patient has an appointment in February and will run out of medication before that time, needs enough medication to last her until appointment  Can we respond through MyChart? Yes  Agent: Please be advised that Rx refills may take up to 3 business days. We ask that you follow-up with your pharmacy.

## 2023-04-26 ENCOUNTER — Ambulatory Visit: Payer: 59 | Admitting: Family Medicine

## 2023-04-26 ENCOUNTER — Other Ambulatory Visit: Payer: Self-pay | Admitting: Family Medicine

## 2023-04-26 DIAGNOSIS — F4521 Hypochondriasis: Secondary | ICD-10-CM

## 2023-04-29 ENCOUNTER — Other Ambulatory Visit: Payer: Self-pay | Admitting: Family Medicine

## 2023-04-29 DIAGNOSIS — F4521 Hypochondriasis: Secondary | ICD-10-CM

## 2023-04-30 ENCOUNTER — Other Ambulatory Visit: Payer: Self-pay | Admitting: Family Medicine

## 2023-04-30 DIAGNOSIS — F4521 Hypochondriasis: Secondary | ICD-10-CM

## 2023-04-30 MED ORDER — VILAZODONE HCL 10 MG PO TABS: 20.0000 mg | ORAL_TABLET | Freq: Every day | ORAL | 2 refills | Status: DC

## 2023-05-21 ENCOUNTER — Encounter: Payer: Self-pay | Admitting: Family Medicine

## 2023-05-21 ENCOUNTER — Ambulatory Visit (INDEPENDENT_AMBULATORY_CARE_PROVIDER_SITE_OTHER): Payer: 59 | Admitting: Family Medicine

## 2023-05-21 VITALS — BP 112/76 | HR 74 | Temp 97.5°F | Wt 134.6 lb

## 2023-05-21 DIAGNOSIS — L989 Disorder of the skin and subcutaneous tissue, unspecified: Secondary | ICD-10-CM

## 2023-05-21 DIAGNOSIS — F331 Major depressive disorder, recurrent, moderate: Secondary | ICD-10-CM | POA: Diagnosis not present

## 2023-05-21 DIAGNOSIS — T8543XS Leakage of breast prosthesis and implant, sequela: Secondary | ICD-10-CM | POA: Diagnosis not present

## 2023-05-21 DIAGNOSIS — Z01818 Encounter for other preprocedural examination: Secondary | ICD-10-CM

## 2023-05-21 DIAGNOSIS — F419 Anxiety disorder, unspecified: Secondary | ICD-10-CM

## 2023-05-21 NOTE — Progress Notes (Unsigned)
Assessment/Plan:   Problem List Items Addressed This Visit   None   There are no discontinued medications.  No follow-ups on file.    Subjective:   Encounter date: 05/21/2023  Sierra Cantrell is a 53 y.o. female who has Anxiety; MDD (major depressive disorder); Ovarian cyst; Vitamin D deficiency; Class 1 obesity due to excess calories without serious comorbidity with body mass index (BMI) of 31.0 to 31.9 in adult; Multiple stiff joints; Status post hysterectomy; Screening for colon cancer; and Breast implant rupture on their problem list..   She  has a past medical history of Abnormal uterine bleeding (AUB) (10/12/2021), Anxiety, Depression, Fibroid, History of abnormal cervical Pap smear, Screening for colon cancer (01/19/2023), and STD (sexually transmitted disease)..   She presents with chief complaint of Medical Management of Chronic Issues (1 month f/u for depression/medication. Implant removal surgery form and preop labs; should pt stop viibryd before surgery.  Spot of concern on bottom of neck. ) .   HPI:     03/06/2023    3:11 PM 01/17/2023    9:40 AM 10/12/2021    2:42 PM  Depression screen PHQ 2/9  Decreased Interest 2 0 3  Down, Depressed, Hopeless 2 0 3  PHQ - 2 Score 4 0 6  Altered sleeping 2 1 0  Tired, decreased energy 0 0 0  Change in appetite 1 1 1   Feeling bad or failure about yourself  2 0 1  Trouble concentrating 2 0 3  Moving slowly or fidgety/restless 0 0 0  Suicidal thoughts 0 0 0  PHQ-9 Score 11 2 11   Difficult doing work/chores Somewhat difficult Not difficult at all Very difficult      03/06/2023    3:11 PM 01/17/2023    9:40 AM 10/12/2021    2:42 PM  GAD 7 : Generalized Anxiety Score  Nervous, Anxious, on Edge 3 1 3   Control/stop worrying 3 1 3   Worry too much - different things 3 1 3   Trouble relaxing 2 1 1   Restless 1 0 0  Easily annoyed or irritable 2 0 0  Afraid - awful might happen 3 1 3   Total GAD 7 Score 17 5 13   Anxiety  Difficulty Very difficult Somewhat difficult Extremely difficult     ROS  Past Surgical History:  Procedure Laterality Date   ABDOMINAL SURGERY     "tummy tuck"   AUGMENTATION MAMMAPLASTY     silicone breast implants   BREAST BIOPSY Right    CERVICAL BIOPSY  W/ LOOP ELECTRODE EXCISION  05/31/2017   CESAREAN SECTION     COSMETIC SURGERY  10/2009 10/2013   ESSURE TUBAL LIGATION     robotic total laparoscopic hysterectomy with bilateral salpingectomy  11/28/2022    Outpatient Medications Prior to Visit  Medication Sig Dispense Refill   progesterone (PROMETRIUM) 100 MG capsule Take 100 mg by mouth at bedtime.     TIRZEPATIDE [place] Pt report 60 units     Vilazodone HCl (VIIBRYD) 10 MG TABS Take 2 tablets (20 mg total) by mouth daily. 60 tablet 2   No facility-administered medications prior to visit.    Family History  Problem Relation Age of Onset   Hypertension Mother    Diabetes Mother    Stroke Father    Hyperlipidemia Father    Coronary artery disease Father    Diabetes Brother    Breast cancer Paternal Grandmother 14    Social History   Socioeconomic History   Marital status:  Married    Spouse name: Not on file   Number of children: Not on file   Years of education: Not on file   Highest education level: Associate degree: occupational, Scientist, product/process development, or vocational program  Occupational History   Not on file  Tobacco Use   Smoking status: Former    Current packs/day: 0.00    Average packs/day: 0.5 packs/day for 10.0 years (5.0 ttl pk-yrs)    Types: Cigarettes    Start date: 09/26/1999    Quit date: 09/25/2009    Years since quitting: 13.6    Passive exposure: Never   Smokeless tobacco: Never  Vaping Use   Vaping status: Never Used  Substance and Sexual Activity   Alcohol use: Never   Drug use: Never   Sexual activity: Yes    Birth control/protection: Surgical    Comment: Essure/partner vas, first intercourse <16  Other Topics Concern   Not on file  Social  History Narrative   Not on file   Social Drivers of Health   Financial Resource Strain: Low Risk  (05/17/2023)   Overall Financial Resource Strain (CARDIA)    Difficulty of Paying Living Expenses: Not hard at all  Food Insecurity: No Food Insecurity (05/17/2023)   Hunger Vital Sign    Worried About Running Out of Food in the Last Year: Never true    Ran Out of Food in the Last Year: Never true  Transportation Needs: No Transportation Needs (05/17/2023)   PRAPARE - Administrator, Civil Service (Medical): No    Lack of Transportation (Non-Medical): No  Physical Activity: Unknown (05/17/2023)   Exercise Vital Sign    Days of Exercise per Week: 0 days    Minutes of Exercise per Session: Not on file  Stress: No Stress Concern Present (05/17/2023)   [employer] of Occupational Health - Occupational Stress Questionnaire    Feeling of Stress : Only a little  Social Connections: Moderately Integrated (05/17/2023)   Social Connection and Isolation Panel [NHANES]    Frequency of Communication with Friends and Family: More than three times a week    Frequency of Social Gatherings with Friends and Family: Once a week    Attends Religious Services: 1 to 4 times per year    Active Member of [employer] or Organizations: No    Attends Engineer, structural: Not on file    Marital Status: Married  Catering manager Violence: Not on file                                                                                                  Objective:  Physical Exam: BP 112/76   Pulse 74   Temp (!) 97.5 F (36.4 C) (Temporal)   Wt 134 lb 9.6 oz (61.1 kg)   LMP  (LMP Unknown)   SpO2 98%   BMI 27.19 kg/m     Physical Exam  MM 3D SCREEN BREAST W/IMPLANT BILATERAL Result Date: 03/05/2023 CLINICAL DATA:  Screening. EXAM: DIGITAL SCREENING BILATERAL MAMMOGRAM WITH IMPLANTS, CAD AND TOMOSYNTHESIS TECHNIQUE: Bilateral screening digital craniocaudal and mediolateral oblique mammograms  were obtained. Bilateral screening digital breast tomosynthesis was performed. The images were evaluated with computer-aided detection. Standard and/or implant displaced views were performed. COMPARISON:  Previous exam(s). ACR Breast Density Category c: The breasts are heterogeneously dense, which may obscure small masses. FINDINGS: The patient has retropectoral implants. There are no findings suspicious for malignancy. IMPRESSION: 1. No mammographic evidence of malignancy. A result letter of this screening mammogram will be mailed directly to the patient. 2. Contour abnormality of the right breast implant suggesting possible implant rupture. RECOMMENDATION: 1.  Screening mammogram in one year. (Code:SM-B-01Y) 2. Consider bilateral breast MRI without contrast to further evaluate implant integrity. BI-RADS CATEGORY  2: Benign. Electronically Signed   By: Frederico Hamman M.D.   On: 03/05/2023 15:37    No results found for this or any previous visit (from the past 2160 hours).      Garner Nash, MD, MS

## 2023-05-28 ENCOUNTER — Other Ambulatory Visit (INDEPENDENT_AMBULATORY_CARE_PROVIDER_SITE_OTHER): Payer: 59

## 2023-05-28 DIAGNOSIS — Z01818 Encounter for other preprocedural examination: Secondary | ICD-10-CM

## 2023-05-28 LAB — CBC WITH DIFFERENTIAL/PLATELET
Basophils Absolute: 0 10*3/uL (ref 0.0–0.1)
Basophils Relative: 0.7 % (ref 0.0–3.0)
Eosinophils Absolute: 0.1 10*3/uL (ref 0.0–0.7)
Eosinophils Relative: 1.1 % (ref 0.0–5.0)
HCT: 38.2 % (ref 36.0–46.0)
Hemoglobin: 12.8 g/dL (ref 12.0–15.0)
Lymphocytes Relative: 20.3 % (ref 12.0–46.0)
Lymphs Abs: 0.9 10*3/uL (ref 0.7–4.0)
MCHC: 33.6 g/dL (ref 30.0–36.0)
MCV: 88 fL (ref 78.0–100.0)
Monocytes Absolute: 0.3 10*3/uL (ref 0.1–1.0)
Monocytes Relative: 7 % (ref 3.0–12.0)
Neutro Abs: 3.3 10*3/uL (ref 1.4–7.7)
Neutrophils Relative %: 70.9 % (ref 43.0–77.0)
Platelets: 345 10*3/uL (ref 150.0–400.0)
RBC: 4.34 Mil/uL (ref 3.87–5.11)
RDW: 13.1 % (ref 11.5–15.5)
WBC: 4.7 10*3/uL (ref 4.0–10.5)

## 2023-05-28 LAB — COMPREHENSIVE METABOLIC PANEL
ALT: 12 U/L (ref 0–35)
AST: 16 U/L (ref 0–37)
Albumin: 4.5 g/dL (ref 3.5–5.2)
Alkaline Phosphatase: 40 U/L (ref 39–117)
BUN: 15 mg/dL (ref 6–23)
CO2: 24 meq/L (ref 19–32)
Calcium: 8.9 mg/dL (ref 8.4–10.5)
Chloride: 103 meq/L (ref 96–112)
Creatinine, Ser: 0.83 mg/dL (ref 0.40–1.20)
GFR: 81.21 mL/min (ref >60.00)
Glucose, Bld: 82 mg/dL (ref 70–99)
Potassium: 4.2 meq/L (ref 3.5–5.1)
Sodium: 137 meq/L (ref 135–145)
Total Bilirubin: 0.9 mg/dL (ref 0.2–1.2)
Total Protein: 7 g/dL (ref 6.0–8.3)

## 2023-05-28 LAB — PROTIME-INR
INR: 1.1 {ratio} — ABNORMAL HIGH (ref 0.8–1.0)
Prothrombin Time: 11.9 s (ref 9.6–13.1)

## 2023-05-28 LAB — HEMOGLOBIN A1C: Hgb A1c MFr Bld: 5.1 % (ref 4.6–6.5)

## 2023-05-28 LAB — APTT: aPTT: 30.1 s (ref 25.4–36.8)

## 2023-06-04 ENCOUNTER — Encounter: Payer: Self-pay | Admitting: Family Medicine

## 2023-06-07 NOTE — Progress Notes (Signed)
 53 y.o. [id number] Married Caucasian female here for annual exam.    Feeling really good since her hysterectomy in August.   Mammogram showed a potential rupture of her right breast implant.  She will have bilateral breast implant removal this month with Dr. Arita Miss in [place].   Using HRT through her surgeon in [place].   Using testosterone/DHEA/estrogen cream and oral progesterone.   Has MTHFR mutation.   PCP: Garnette Gunner, MD   No LMP recorded (lmp unknown). Patient has had a hysterectomy.           Sexually active: Yes.    The current method of family planning is vasectomy/hyst.    Menopausal hormone therapy:  progesterone Exercising: No.   Smoker:  former  OB History  Gravida Para Term Preterm AB Living  6 3 2 1 3 4   SAB IAB Ectopic Multiple Live Births  3   1 4     # Outcome Date GA Lbr Len/2nd Weight Sex Type Anes PTL Lv  6 SAB           5 SAB           4 SAB           3A Preterm           3B Preterm           2 Term           1 Term              HEALTH MAINTENANCE: Last 2 paps:  05/08/22 LSIL: HR HPV positive, 05-02-21 ASCUS:Pos HR HPV  History of abnormal Pap or positive HPV:  yes,  Hx of LEEP revealing CIN II 2019  M[facility]ogram:   03/02/23 [facility]  Colonoscopy:  04/02/23, polyp removed - due in 2029.  Bone Density:  n/a  Result  n/a    There is no immunization history on file for this patient.    reports that she quit smoking about 13 years ago. Her smoking use included cigarettes. She started smoking about 23 years ago. She has a 5 pack-year smoking history. She has never been exposed to tobacco smoke. She has never used smokeless tobacco. She reports that she does not drink alcohol and does not use drugs.  Past Medical History:  Diagnosis Date   Abnormal uterine bleeding (AUB) 10/12/2021   Anxiety    Depression    Fibroid    History of abnormal cervical Pap smear    Hx LEEP procedure 2019   Screening for colon cancer 01/19/2023    STD (sexually transmitted disease)    HR HPV    Past Surgical History:  Procedure Laterality Date   ABDOMINAL SURGERY     "tummy tuck"   AUGMENTATION MAMMAPLASTY     silicone breast implants   BREAST BIOPSY Right    CERVICAL BIOPSY  W/ LOOP ELECTRODE EXCISION  05/31/2017   CESAREAN SECTION     COSMETIC SURGERY  10/2009 10/2013   ESSURE TUBAL LIGATION     robotic total laparoscopic hysterectomy with bilateral salpingectomy  11/28/2022    Current Outpatient Medications  Medication Sig Dispense Refill   HYDROcodone-acetaminophen (NORCO) 7.5-325 MG tablet Take 1 tablet by mouth 4 (four) times daily as needed.     NONFORMULARY OR COMPOUNDED ITEM estrogen/testosterone     ondansetron (ZOFRAN-ODT) 4 MG disintegrating tablet Take 4 mg by mouth every 6 (six) hours as needed.     progesterone (PROMETRIUM) 100  MG capsule Take 100 mg by mouth at bedtime.     TIRZEPATIDE [place] Pt report 60 units     Vilazodone HCl (VIIBRYD) 10 MG TABS Take 2 tablets (20 mg total) by mouth daily. 60 tablet 2   No current facility-administered medications for this visit.    ALLERGIES: Patient has no active allergies.  Family History  Problem Relation Age of Onset   Hypertension Mother    Diabetes Mother    Colon cancer Mother    Stroke Father    Hyperlipidemia Father    Coronary artery disease Father    Diabetes Brother    Breast cancer Paternal Grandmother 63    Review of Systems  All other systems reviewed and are negative.   PHYSICAL EXAM:  BP 124/70 (BP Location: Left Arm, Patient Position: Sitting, Cuff Size: Small)   Pulse 85   Ht 4' 9.75" (1.467 m)   Wt 129 lb (58.5 kg)   LMP  (LMP Unknown)   SpO2 99%   BMI 27.19 kg/m     General appearance: alert, cooperative and appears stated age Head: normocephalic, without obvious abnormality, atraumatic Neck: no adenopathy, supple, symmetrical, trachea midline and thyroid normal to inspection and palpation Lungs: clear to auscultation  bilaterally Breasts: bilateral implants, no masses or tenderness, No nipple retraction or dimpling, No nipple discharge or bleeding, No axillary adenopathy Heart: regular rate and rhythm Abdomen: soft, non-tender; no masses, no organomegaly Extremities: extremities normal, atraumatic, no cyanosis or edema Skin: skin color, texture, turgor normal. No rashes or lesions Lymph nodes: cervical, supraclavicular, and axillary nodes normal. Neurologic: grossly normal  Pelvic: External genitalia:  no lesions              No abnormal inguinal nodes palpated.              Urethra:  normal appearing urethra with no masses, tenderness or lesions              Bartholins and Skenes: normal                 Vagina: normal appearing vagina with normal color and discharge, no lesions.  Vaginal cuff is well healed.               Cervix: absent              Pap taken: Yes.   Bimanual Exam:  Uterus:  absent              Adnexa: no mass, fullness, tenderness              Rectal exam: yes.  Confirms.              Anus:  normal sphincter tone, no lesions  Chaperone was present for exam:  Warren Lacy, CMA  ASSESSMENT: Well woman visit with gynecologic exam. Status post robotic hysterectomy with bilateral salpingectomy, removal of Essure 11/2022.  HRT, Testosterone, and DHEA therapy.  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  Colposcopy 06/15/22:  CIN I. 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  Vaginal cancer screening.  Hx bilateral breast augmentation.  PHQ-9: 0 FH colon cancer in mother.  MTHFR.   PLAN: Mammogram screening discussed. Self breast awareness reviewed. Pap and HRV collected:  yes Guidelines for Calcium, Vitamin D, regular exercise program including cardiovascular and weight bearing exercise. Medication refills:  NA HRT through her surgeon in [place].  Will get a copy of pathology report from her hysterectomy.  Follow up:  yearly and prn.

## 2023-06-21 ENCOUNTER — Ambulatory Visit (INDEPENDENT_AMBULATORY_CARE_PROVIDER_SITE_OTHER): Payer: 59 | Admitting: Obstetrics and Gynecology

## 2023-06-21 ENCOUNTER — Other Ambulatory Visit (HOSPITAL_COMMUNITY)
Admission: RE | Admit: 2023-06-21 | Discharge: 2023-06-21 | Disposition: A | Discharge status: Home / Self Care | Source: Ambulatory Visit | Attending: Obstetrics and Gynecology | Admitting: Obstetrics and Gynecology

## 2023-06-21 ENCOUNTER — Encounter: Payer: Self-pay | Admitting: Obstetrics and Gynecology

## 2023-06-21 VITALS — BP 124/70 | HR 85 | Ht <= 58 in | Wt 129.0 lb

## 2023-06-21 DIAGNOSIS — Z1272 Encounter for screening for malignant neoplasm of vagina: Secondary | ICD-10-CM | POA: Insufficient documentation

## 2023-06-21 DIAGNOSIS — Z79890 Hormone replacement therapy (postmenopausal): Secondary | ICD-10-CM

## 2023-06-21 DIAGNOSIS — Z87410 Personal history of cervical dysplasia: Secondary | ICD-10-CM | POA: Insufficient documentation

## 2023-06-21 DIAGNOSIS — Z1331 Encounter for screening for depression: Secondary | ICD-10-CM

## 2023-06-21 DIAGNOSIS — Z01419 Encounter for gynecological examination (general) (routine) without abnormal findings: Secondary | ICD-10-CM | POA: Diagnosis not present

## 2023-06-21 DIAGNOSIS — Z8619 Personal history of other infectious and parasitic diseases: Secondary | ICD-10-CM

## 2023-06-21 NOTE — Patient Instructions (Signed)

## 2023-06-27 LAB — CYTOLOGY - PAP
Comment: NEGATIVE
High risk HPV: POSITIVE — AB

## 2023-06-29 ENCOUNTER — Other Ambulatory Visit: Payer: Self-pay | Admitting: Obstetrics and Gynecology

## 2023-06-29 ENCOUNTER — Encounter: Payer: Self-pay | Admitting: Obstetrics and Gynecology

## 2023-06-29 DIAGNOSIS — R87622 Low grade squamous intraepithelial lesion on cytologic smear of vagina (LGSIL): Secondary | ICD-10-CM

## 2023-06-29 DIAGNOSIS — R87811 Vaginal high risk human papillomavirus (HPV) DNA test positive: Secondary | ICD-10-CM

## 2023-06-29 NOTE — Progress Notes (Signed)
 Order placed for colposcopy for pap LGSIL of vagina and positive HR HPV.

## 2023-07-19 ENCOUNTER — Other Ambulatory Visit: Payer: Self-pay | Admitting: Family Medicine

## 2023-07-19 DIAGNOSIS — F4521 Hypochondriasis: Secondary | ICD-10-CM

## 2023-07-19 NOTE — Progress Notes (Signed)
 GYNECOLOGY  VISIT   HPI: 53 y.o.   Married  Caucasian female   [phone] with No LMP recorded (lmp unknown). Patient has had a hysterectomy.   here for: colposcopy of vagina due to pap showing LGSIL and positive HR HPV.      GYNECOLOGIC HISTORY: No LMP recorded (lmp unknown). Patient has had a hysterectomy. Contraception:  hyst Menopausal hormone therapy:  progesterone Last 2 paps:  06/21/23 LSIL: HR HPV positive, 05/08/22 LSIL: HR HPV positive  History of abnormal Pap or positive HPV:  yes, Hx of LEEP revealing CIN II 2019  Mammogram:  03/02/23 [facility]         OB History     Gravida  6   Para  3   Term  2   Preterm  1   AB  3   Living  4      SAB  3   IAB      Ectopic      Multiple  1   Live Births  4              Patient Active Problem List   Diagnosis Date Noted   Breast implant rupture 03/08/2023   Class 1 obesity due to excess calories without serious comorbidity with body mass index (BMI) of 31.0 to 31.9 in adult 01/19/2023   Multiple stiff joints 01/19/2023   Status post hysterectomy 01/19/2023   Screening for colon cancer 01/19/2023   Vitamin D  deficiency 01/17/2023   Anxiety 10/12/2021   MDD (major depressive disorder) 10/12/2021   Ovarian cyst 09/26/2021    Past Medical History:  Diagnosis Date   Abnormal uterine bleeding (AUB) 10/12/2021   Anxiety    Depression    Fibroid    History of abnormal cervical Pap smear    Hx LEEP procedure 2019   Screening for colon cancer 01/19/2023   STD (sexually transmitted disease)    HR HPV    Past Surgical History:  Procedure Laterality Date   ABDOMINAL SURGERY     "tummy tuck"   AUGMENTATION MAMMAPLASTY     silicone breast implants   BREAST BIOPSY Right    CERVICAL BIOPSY  W/ LOOP ELECTRODE EXCISION  05/31/2017   CESAREAN SECTION     COSMETIC SURGERY  10/2009 10/2013   ESSURE TUBAL LIGATION     robotic total laparoscopic hysterectomy with bilateral salpingectomy   11/28/2022    Current Outpatient Medications  Medication Sig Dispense Refill   HYDROcodone-acetaminophen (NORCO) 7.5-325 MG tablet Take 1 tablet by mouth 4 (four) times daily as needed.     NONFORMULARY OR COMPOUNDED ITEM estrogen/testosterone     ondansetron (ZOFRAN-ODT) 4 MG disintegrating tablet Take 4 mg by mouth every 6 (six) hours as needed.     progesterone (PROMETRIUM) 100 MG capsule Take 100 mg by mouth at bedtime.     TIRZEPATIDE  Pt report 60 units     Vilazodone  HCl 20 MG TABS Take 1 tablet (20 mg total) by mouth daily. 90 tablet 1   No current facility-administered medications for this visit.     ALLERGIES: Patient has no active allergies.  Family History  Problem Relation Age of Onset   Hypertension Mother    Diabetes Mother    Colon cancer Mother    Stroke Father    Hyperlipidemia Father    Coronary artery disease Father    Diabetes Brother    Breast cancer Paternal Grandmother 64    Social  History   Socioeconomic History   Marital status: Married    Spouse name: Not on file   Number of children: Not on file   Years of education: Not on file   Highest education level: Associate degree: occupational, Scientist, product/process development, or vocational program  Occupational History   Not on file  Tobacco Use   Smoking status: Former    Current packs/day: 0.00    Average packs/day: 0.5 packs/day for 10.0 years (5.0 ttl pk-yrs)    Types: Cigarettes    Start date: 09/26/1999    Quit date: 09/25/2009    Years since quitting: 13.8    Passive exposure: Never   Smokeless tobacco: Never  Vaping Use   Vaping status: Never Used  Substance and Sexual Activity   Alcohol use: Never   Drug use: Never   Sexual activity: Yes    Birth control/protection: Surgical    Comment: hyst/vasectomy, first intercourse <16  Other Topics Concern   Not on file  Social History Narrative   Not on file   Social Drivers of Health   Financial Resource Strain: Low Risk  (05/17/2023)   Overall Financial  Resource Strain (CARDIA)    Difficulty of Paying Living Expenses: Not hard at all  Food Insecurity: No Food Insecurity (05/17/2023)   Hunger Vital Sign    Worried About Running Out of Food in the Last Year: Never true    Ran Out of Food in the Last Year: Never true  Transportation Needs: No Transportation Needs (05/17/2023)   PRAPARE - Administrator, Civil Service (Medical): No    Lack of Transportation (Non-Medical): No  Physical Activity: Unknown (05/17/2023)   Exercise Vital Sign    Days of Exercise per Week: 0 days    Minutes of Exercise per Session: Not on file  Stress: No Stress Concern Present (05/17/2023)   [employer] of Occupational Health - Occupational Stress Questionnaire    Feeling of Stress : Only a little  Social Connections: Moderately Integrated (05/17/2023)   Social Connection and Isolation Panel [NHANES]    Frequency of Communication with Friends and Family: More than three times a week    Frequency of Social Gatherings with Friends and Family: Once a week    Attends Religious Services: 1 to 4 times per year    Active Member of [employer] or Organizations: No    Attends Engineer, structural: Not on file    Marital Status: Married  Catering manager Violence: Not on file    Review of Systems  All other systems reviewed and are negative.   PHYSICAL EXAMINATION:   BP 124/84 (BP Location: Right Arm, Patient Position: Sitting, Cuff Size: Small)   Pulse 86   Ht 4' 9.75" (1.467 m)   Wt 129 lb (58.5 kg)   LMP  (LMP Unknown)   SpO2 97%   BMI 27.19 kg/m     General appearance: alert, cooperative and appears stated age  Colposcopy - vagina. Consent for procedure.  Time out done.  3% acetic acid used in vagina.  Cervix absent.   White light and green light filter used.  No lesions noted in vagina. Lugol's applied.  Scattered decreased uptake throughout the vaginal vault in 1 mm areas and in the right vaginal apex.     Biopsies:   mid vaginal  cuff and right vaginal apex, each to pathology separately.  Monsel's placed.  Minimal EBL. No complications.   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  HPV changes noted today.  Status post LEEP in 2019 - CIN II.  Status post hysterectomy 2024 in [place] .  Pathology report is not available at the time of this colposcopy.   PLAN:  Follow up biopsies.  If pathology reports show, LGSIL, will plan for pap and HR HPV in one year.  If moderate or high grade dysplasia, will need with treatment.  We briefly discussed Gardasil which is used for prevention of HPV infection, dysplasia, and cancer in women under the age of 53 yo.

## 2023-07-21 ENCOUNTER — Encounter: Payer: Self-pay | Admitting: Family Medicine

## 2023-07-21 DIAGNOSIS — F4521 Hypochondriasis: Secondary | ICD-10-CM

## 2023-07-23 ENCOUNTER — Other Ambulatory Visit: Payer: Self-pay | Admitting: Medical Genetics

## 2023-07-24 ENCOUNTER — Other Ambulatory Visit (HOSPITAL_COMMUNITY): Payer: Self-pay

## 2023-07-24 MED ORDER — VILAZODONE HCL 20 MG PO TABS: 1.0000 | ORAL_TABLET | Freq: Every day | ORAL | 1 refills | Status: DC

## 2023-08-02 ENCOUNTER — Ambulatory Visit (INDEPENDENT_AMBULATORY_CARE_PROVIDER_SITE_OTHER): Admitting: Obstetrics and Gynecology

## 2023-08-02 ENCOUNTER — Encounter: Payer: Self-pay | Admitting: Obstetrics and Gynecology

## 2023-08-02 ENCOUNTER — Other Ambulatory Visit (HOSPITAL_COMMUNITY)
Admission: RE | Admit: 2023-08-02 | Discharge: 2023-08-02 | Disposition: A | Discharge status: Home / Self Care | Source: Ambulatory Visit | Attending: Obstetrics and Gynecology | Admitting: Obstetrics and Gynecology

## 2023-08-02 VITALS — BP 124/84 | HR 86 | Ht <= 58 in | Wt 129.0 lb

## 2023-08-02 DIAGNOSIS — R87811 Vaginal high risk human papillomavirus (HPV) DNA test positive: Secondary | ICD-10-CM | POA: Insufficient documentation

## 2023-08-02 DIAGNOSIS — R87622 Low grade squamous intraepithelial lesion on cytologic smear of vagina (LGSIL): Secondary | ICD-10-CM | POA: Diagnosis present

## 2023-08-02 NOTE — Patient Instructions (Signed)
 Colposcopy, Care After  The following information offers guidance on how to care for yourself after your procedure. Your health care provider may also give you more specific instructions. If you have problems or questions, contact your health care provider. What can I expect after the procedure? If you had a colposcopy without a biopsy, you can expect to feel fine right away after your procedure. However, you may have some spotting of blood for a few days. You can return to your normal activities. If you had a colposcopy with a biopsy, it is common after the procedure to have: Soreness and mild pain. These may last for a few days. Mild vaginal bleeding or discharge that is dark-colored and grainy. This may last for a few days. The discharge may be caused by a liquid (solution) that was used during the procedure. You may need to wear a sanitary pad during this time. Spotting of blood for at least 48 hours after the procedure. Follow these instructions at home: Medicines Take over-the-counter and prescription medicines only as told by your health care provider. Talk with your health care provider about what type of over-the-counter pain medicines and prescription medicines you can start to take again. It is especially important to talk with your health care provider if you take blood thinners. Activity Avoid using douche products, using tampons, and having sex for at least 3 days after the procedure or for as long as told by your health care provider. Return to your normal activities as told by your health care provider. Ask your health care provider what activities are safe for you. General instructions Ask your health care provider if you may take baths, swim, or use a hot tub. You may take showers. If you use birth control (contraception), continue to use it. Keep all follow-up visits. This is important. Contact a health care provider if: You have a fever or chills. You faint or feel  light-headed. Get help right away if: You have heavy bleeding from your vagina or pass blood clots. Heavy bleeding is bleeding that soaks through a sanitary pad in less than 1 hour. You have vaginal discharge that is abnormal, is yellow in color, or smells bad. This could be a sign of infection. You have severe pain or cramps in your lower abdomen that do not go away with medicine. Summary If you had a colposcopy without a biopsy, you can expect to feel fine right away, but you may have some spotting of blood for a few days. You can return to your normal activities. If you had a colposcopy with a biopsy, it is common to have mild pain for a few days and spotting for 48 hours after the procedure. Avoid using douche products, using tampons, and having sex for at least 3 days after the procedure or for as long as told by your health care provider. Get help right away if you have heavy bleeding, severe pain, or signs of infection. This information is not intended to replace advice given to you by your health care provider. Make sure you discuss any questions you have with your health care provider. Document Revised: 08/29/2020 Document Reviewed: 08/29/2020 Elsevier Patient Education  2024 [employer].

## 2023-08-03 LAB — SURGICAL PATHOLOGY

## 2023-08-06 ENCOUNTER — Encounter: Payer: Self-pay | Admitting: Obstetrics and Gynecology

## 2023-11-22 ENCOUNTER — Encounter: Payer: Self-pay | Admitting: Family Medicine

## 2024-01-11 ENCOUNTER — Ambulatory Visit: Admitting: Family Medicine

## 2024-01-22 ENCOUNTER — Encounter: Payer: Self-pay | Admitting: Family Medicine

## 2024-01-22 ENCOUNTER — Ambulatory Visit (INDEPENDENT_AMBULATORY_CARE_PROVIDER_SITE_OTHER): Admitting: Family Medicine

## 2024-01-22 VITALS — BP 103/71 | HR 84 | Temp 97.6°F | Resp 18 | Wt 124.8 lb

## 2024-01-22 DIAGNOSIS — F4521 Hypochondriasis: Secondary | ICD-10-CM | POA: Diagnosis not present

## 2024-01-22 DIAGNOSIS — F419 Anxiety disorder, unspecified: Secondary | ICD-10-CM | POA: Diagnosis not present

## 2024-01-22 DIAGNOSIS — Z6831 Body mass index (BMI) 31.0-31.9, adult: Secondary | ICD-10-CM

## 2024-01-22 DIAGNOSIS — E66811 Obesity, class 1: Secondary | ICD-10-CM

## 2024-01-22 DIAGNOSIS — E6609 Other obesity due to excess calories: Secondary | ICD-10-CM

## 2024-01-22 DIAGNOSIS — F3341 Major depressive disorder, recurrent, in partial remission: Secondary | ICD-10-CM | POA: Diagnosis not present

## 2024-01-22 MED ORDER — VILAZODONE HCL 20 MG PO TABS: 1.0000 | ORAL_TABLET | Freq: Every day | ORAL | 3 refills | Status: AC

## 2024-01-22 NOTE — Progress Notes (Signed)
 Assessment & Plan   Assessment/Plan:    Assessment & Plan Generalized anxiety disorder and major depressive disorder Chronic anxiety and depression, well-managed on vilazodone  20 mg daily. Reports increased anxiety at work but manages it effectively. No longer experiences crying episodes, indicating improved mood stability. Headaches occurred when vilazodone  was tapered before surgery, but resolved after resuming the full dose. - Continue vilazodone  20 mg daily - Refill vilazodone  prescription  Hormone replacement therapy status post bilateral salpingectomy and hysterectomy Status post bilateral salpingectomy and hysterectomy. Currently on hormone replacement therapy with progesterone, estrogen, DHEA, and testosterone. Recent increase in estrogen dosage led to transient mood changes, which have since stabilized. No history of hot flashes, indicating adequate estrogen levels. - Continue current hormone replacement therapy regimen  Mild hypercholesterolemia Mildly elevated cholesterol. Previous lipid panel was not fasting, and cholesterol levels were not high enough to warrant medication. - Order fasting lipid panel      Medications Discontinued During This Encounter  Medication Reason   HYDROcodone-acetaminophen (NORCO) 7.5-325 MG tablet    ondansetron (ZOFRAN-ODT) 4 MG disintegrating tablet    NONFORMULARY OR COMPOUNDED ITEM    Vilazodone  HCl 20 MG TABS Reorder    Return in about 6 months (around 07/22/2024) for physical (fasting labs on 07/22/2024).        Subjective:   Encounter date: 01/22/2024  Sierra Cantrell is a 53 y.o. female who has Anxiety; MDD (major depressive disorder); Ovarian cyst; Vitamin D  deficiency; Class 1 obesity due to excess calories without serious comorbidity with body mass index (BMI) of 31.0 to 31.9 in adult; Multiple stiff joints; Status post hysterectomy; Screening for colon cancer; Breast implant rupture; and Illness anxiety disorder on their  problem list..   She  has a past medical history of Abnormal uterine bleeding (AUB) (10/12/2021), Anxiety, Depression, Fibroid, History of abnormal cervical Pap smear, Screening for colon cancer (01/19/2023), and STD (sexually transmitted disease)..   She presents with chief complaint of Medication Refill (Rx refill request for( Vilazodone  HCl 20 MG TABS)//HM due- vaccinations  (patient declined) shingles vaccine request from [facility] ) .   Discussed the use of AI scribe software for clinical note transcription with the patient, who gave verbal consent to proceed.  History of Present Illness Sierra Cantrell is a 53 year old female who presents for follow-up and medication refill for chronic anxiety and depression.  Mood disturbance and anxiety - Chronic anxiety and depression treated with vilazodone  20 mg daily - Mood stable since resuming 20 mg dose post-surgery - No frequent crying episodes - Persistent anxiety at work attributed to occupational stress - No history of hot flashes  Hormone replacement therapy and menopausal symptoms - Bilateral salpingectomy and total hysterectomy completed - On hormone replacement therapy: progesterone 100 mg nightly and increased dose of estrogen cream for bone health - Experienced mood changes during adjustment to increased estrogen dose, currently stable - No vasomotor symptoms  Post-surgical recovery and breast health - Underwent surgery for ruptured right breast implant earlier this year; implant and most of capsule removed - Left breast implant previously removed - Recovery from breast surgery was uneventful with no chronic issues - Undergoes annual mammograms and is due for next screening soon  Preventive health maintenance - Recent blood donation without complications - Colonoscopy completed December of previous year - Endometrial biopsy performed in 2023 - Due for mammogram and Pap smear      06/21/2023    9:34 AM 05/21/2023    9:44 AM  03/06/2023  3:11 PM 01/17/2023    9:40 AM 10/12/2021    2:42 PM  Depression screen PHQ 2/9  Decreased Interest 0 1 2 0 3  Down, Depressed, Hopeless 0 0 2 0 3  PHQ - 2 Score 0 1 4 0 6  Altered sleeping  1 2 1  0  Tired, decreased energy  1 0 0 0  Change in appetite  0 1 1 1   Feeling bad or failure about yourself   0 2 0 1  Trouble concentrating  1 2 0 3  Moving slowly or fidgety/restless  0 0 0 0  Suicidal thoughts  0 0 0 0  PHQ-9 Score  4 11 2 11   Difficult doing work/chores  Not difficult at all Somewhat difficult Not difficult at all Very difficult      05/21/2023    9:44 AM 03/06/2023    3:11 PM 01/17/2023    9:40 AM 10/12/2021    2:42 PM  GAD 7 : Generalized Anxiety Score  Nervous, Anxious, on Edge 1 3 1 3   Control/stop worrying 0 3 1 3   Worry too much - different things 0 3 1 3   Trouble relaxing 0 2 1 1   Restless 0 1 0 0  Easily annoyed or irritable 0 2 0 0  Afraid - awful might happen 1 3 1 3   Total GAD 7 Score 2 17 5 13   Anxiety Difficulty Not difficult at all Very difficult Somewhat difficult Extremely difficult      ROS  Past Surgical History:  Procedure Laterality Date   ABDOMINAL SURGERY     tummy tuck   AUGMENTATION MAMMAPLASTY     silicone breast implants   BREAST BIOPSY Right    CERVICAL BIOPSY  W/ LOOP ELECTRODE EXCISION  05/31/2017   CESAREAN SECTION     COSMETIC SURGERY  10/2009 10/2013   ESSURE TUBAL LIGATION     robotic total laparoscopic hysterectomy with bilateral salpingectomy  11/28/2022   Pathology:  benign cervix, benign endometrium, fibroids and adenomyosis, serous cystadenofibroma of right tube    Outpatient Medications Prior to Visit  Medication Sig Dispense Refill   progesterone (PROMETRIUM) 100 MG capsule Take 100 mg by mouth at bedtime.     TIRZEPATIDE [place] Pt report 60 units     Vilazodone  HCl 20 MG TABS Take 1 tablet (20 mg total) by mouth daily. 90 tablet 1   HYDROcodone-acetaminophen (NORCO) 7.5-325 MG tablet Take 1 tablet by  mouth 4 (four) times daily as needed. (Patient not taking: Reported on 01/22/2024)     NONFORMULARY OR COMPOUNDED ITEM estrogen/testosterone (Patient not taking: Reported on 01/22/2024)     ondansetron (ZOFRAN-ODT) 4 MG disintegrating tablet Take 4 mg by mouth every 6 (six) hours as needed. (Patient not taking: Reported on 01/22/2024)     No facility-administered medications prior to visit.    Family History  Problem Relation Age of Onset   Hypertension Mother    Diabetes Mother    Colon cancer Mother    Stroke Father    Hyperlipidemia Father    Coronary artery disease Father    Diabetes Brother    Breast cancer Paternal Grandmother 41    Social History   Socioeconomic History   Marital status: Married    Spouse name: Not on file   Number of children: Not on file   Years of education: Not on file   Highest education level: Associate degree: occupational, Scientist, product/process development, or vocational program  Occupational History   Not on  file  Tobacco Use   Smoking status: Former    Current packs/day: 0.00    Average packs/day: 0.5 packs/day for 10.0 years (5.0 ttl pk-yrs)    Types: Cigarettes    Start date: 09/26/1999    Quit date: 09/25/2009    Years since quitting: 14.3    Passive exposure: Never   Smokeless tobacco: Never  Vaping Use   Vaping status: Never Used  Substance and Sexual Activity   Alcohol use: Never   Drug use: Never   Sexual activity: Yes    Birth control/protection: Surgical    Comment: hyst/vasectomy, first intercourse <16  Other Topics Concern   Not on file  Social History Narrative   Not on file   Social Drivers of Health   Financial Resource Strain: Low Risk  (01/21/2024)   Overall Financial Resource Strain (CARDIA)    Difficulty of Paying Living Expenses: Not hard at all  Food Insecurity: No Food Insecurity (01/21/2024)   Hunger Vital Sign    Worried About Running Out of Food in the Last Year: Never true    Ran Out of Food in the Last Year: Never true   Transportation Needs: No Transportation Needs (01/21/2024)   PRAPARE - Administrator, Civil Service (Medical): No    Lack of Transportation (Non-Medical): No  Physical Activity: Inactive (01/21/2024)   Exercise Vital Sign    Days of Exercise per Week: 0 days    Minutes of Exercise per Session: Not on file  Stress: No Stress Concern Present (01/21/2024)   [employer] of Occupational Health - Occupational Stress Questionnaire    Feeling of Stress: Not at all  Social Connections: Moderately Integrated (01/21/2024)   Social Connection and Isolation Panel    Frequency of Communication with Friends and Family: More than three times a week    Frequency of Social Gatherings with Friends and Family: Once a week    Attends Religious Services: 1 to 4 times per year    Active Member of [employer] or Organizations: No    Attends Engineer, structural: Not on file    Marital Status: Married  Catering manager Violence: Not on file                                                                                                  Objective:  Physical Exam: BP 103/71 (BP Location: Left Arm, Patient Position: Sitting, Cuff Size: Normal)   Pulse 84   Temp 97.6 F (36.4 C) (Temporal)   Resp 18   Wt 124 lb 12.8 oz (56.6 kg)   LMP  (LMP Unknown)   SpO2 100%   BMI 26.31 kg/m     Physical Exam GENERAL: Alert, cooperative, well developed, no acute distress HEENT: Normocephalic, normal oropharynx, moist mucous membranes CHEST: Clear to auscultation bilaterally, no wheezes, rhonchi, or crackles, lungs normal CARDIOVASCULAR: Normal heart rate and rhythm, S1 and S2 normal without murmurs, heart normal ABDOMEN: Soft, non-tender, non-distended, without organomegaly, normal bowel sounds EXTREMITIES: No cyanosis or edema NEUROLOGICAL: Cranial nerves grossly intact, moves all extremities without gross motor or  sensory deficit   Physical Exam  No results found.  No results found for  this or any previous visit (from the past 2160 hours).      Beverley Adine Hummer, MD, MS

## 2024-02-06 ENCOUNTER — Other Ambulatory Visit: Payer: Self-pay | Admitting: Obstetrics and Gynecology

## 2024-02-06 DIAGNOSIS — Z1231 Encounter for screening mammogram for malignant neoplasm of breast: Secondary | ICD-10-CM

## 2024-02-11 ENCOUNTER — Encounter: Payer: Self-pay | Admitting: Obstetrics and Gynecology

## 2024-02-11 MED ORDER — HPV 9-VALENT RECOMB VACCINE IM SUSP: 0.5000 mL | Freq: Once | INTRAMUSCULAR | 0 refills | Status: AC

## 2024-02-11 NOTE — Telephone Encounter (Signed)
 AEX 08/02/23.   Dr. Nikki -please advise on Rx

## 2024-02-12 ENCOUNTER — Other Ambulatory Visit: Payer: Self-pay | Admitting: Medical Genetics

## 2024-02-12 DIAGNOSIS — Z006 Encounter for examination for normal comparison and control in clinical research program: Secondary | ICD-10-CM

## 2024-02-26 ENCOUNTER — Encounter

## 2024-03-10 ENCOUNTER — Ambulatory Visit
Admission: RE | Admit: 2024-03-10 | Discharge: 2024-03-10 | Disposition: A | Discharge status: Home / Self Care | Source: Ambulatory Visit

## 2024-03-10 DIAGNOSIS — Z1231 Encounter for screening mammogram for malignant neoplasm of breast: Secondary | ICD-10-CM | POA: Diagnosis not present

## 2024-03-17 ENCOUNTER — Other Ambulatory Visit (INDEPENDENT_AMBULATORY_CARE_PROVIDER_SITE_OTHER)

## 2024-03-17 ENCOUNTER — Encounter: Payer: Self-pay | Admitting: Obstetrics and Gynecology

## 2024-03-17 DIAGNOSIS — E66811 Obesity, class 1: Secondary | ICD-10-CM | POA: Diagnosis not present

## 2024-03-17 DIAGNOSIS — E6609 Other obesity due to excess calories: Secondary | ICD-10-CM | POA: Diagnosis not present

## 2024-03-17 DIAGNOSIS — R87622 Low grade squamous intraepithelial lesion on cytologic smear of vagina (LGSIL): Secondary | ICD-10-CM

## 2024-03-17 DIAGNOSIS — Z6831 Body mass index (BMI) 31.0-31.9, adult: Secondary | ICD-10-CM

## 2024-03-17 DIAGNOSIS — R87811 Vaginal high risk human papillomavirus (HPV) DNA test positive: Secondary | ICD-10-CM

## 2024-03-17 LAB — LIPID PANEL
Cholesterol: 197 mg/dL (ref 0–200)
HDL: 55.3 mg/dL (ref >39.00)
LDL Cholesterol: 130 mg/dL — ABNORMAL HIGH (ref 0–99)
NonHDL: 142.02
Total CHOL/HDL Ratio: 4
Triglycerides: 60 mg/dL (ref 0.0–149.0)
VLDL: 12 mg/dL (ref 0.0–40.0)

## 2024-03-17 NOTE — Telephone Encounter (Signed)
 Per review of EPIC, 1st Gardasil vaccine received on 02/11/24.   Order pended for Gardasil vaccine at 2 months, 04/13/24  Routing to Dr. Nikki

## 2024-03-18 ENCOUNTER — Encounter: Payer: Self-pay | Admitting: Family Medicine

## 2024-03-18 MED ORDER — HPV 9-VALENT RECOMB VACCINE IM SUSP: 0.5000 mL | Freq: Once | INTRAMUSCULAR | 0 refills | Status: AC

## 2024-03-18 NOTE — Telephone Encounter (Signed)
 I called and spoke with patient and she will have updated paperwork sent since one is chart is dated 2024.

## 2024-03-20 ENCOUNTER — Ambulatory Visit: Payer: Self-pay | Admitting: Obstetrics and Gynecology

## 2024-03-21 ENCOUNTER — Ambulatory Visit: Payer: Self-pay | Admitting: Family Medicine

## 2024-04-28 ENCOUNTER — Other Ambulatory Visit

## 2024-06-25 ENCOUNTER — Ambulatory Visit: Admitting: Obstetrics and Gynecology

## 2024-07-22 ENCOUNTER — Encounter: Admitting: Family Medicine
